# Patient Record
Sex: Female | Born: 1965 | Race: Black or African American | Hispanic: No | Smoking: Current every day smoker
Health system: Southern US, Community
[De-identification: ages and names within clinical notes are randomized; demographics above are authoritative.]

## PROBLEM LIST (undated history)

## (undated) DIAGNOSIS — L84 Corns and callosities: Secondary | ICD-10-CM

## (undated) DIAGNOSIS — F32A Depression, unspecified: Secondary | ICD-10-CM

## (undated) DIAGNOSIS — G9589 Other specified diseases of spinal cord: Secondary | ICD-10-CM

## (undated) DIAGNOSIS — M479 Spondylosis, unspecified: Secondary | ICD-10-CM

## (undated) DIAGNOSIS — I10 Essential (primary) hypertension: Secondary | ICD-10-CM

## (undated) DIAGNOSIS — J449 Chronic obstructive pulmonary disease, unspecified: Secondary | ICD-10-CM

## (undated) DIAGNOSIS — F329 Major depressive disorder, single episode, unspecified: Secondary | ICD-10-CM

## (undated) DIAGNOSIS — F419 Anxiety disorder, unspecified: Secondary | ICD-10-CM

## (undated) HISTORY — DX: Corns and callosities: L84

## (undated) HISTORY — DX: Other specified diseases of spinal cord: G95.89

## (undated) HISTORY — DX: Anxiety disorder, unspecified: F41.9

## (undated) HISTORY — DX: Chronic obstructive pulmonary disease, unspecified: J44.9

## (undated) HISTORY — PX: SPINE SURGERY: SHX786

## (undated) HISTORY — DX: Major depressive disorder, single episode, unspecified: F32.9

## (undated) HISTORY — PX: RIGHT OOPHORECTOMY: SHX2359

## (undated) HISTORY — DX: Depression, unspecified: F32.A

## (undated) HISTORY — DX: Spondylosis, unspecified: M47.9

## (undated) HISTORY — PX: CERVICAL SPINE SURGERY: SHX589

---

## 2005-03-31 ENCOUNTER — Emergency Department: Payer: Self-pay | Admitting: General Practice

## 2005-05-06 ENCOUNTER — Emergency Department: Payer: Self-pay | Admitting: Emergency Medicine

## 2005-10-18 ENCOUNTER — Inpatient Hospital Stay: Payer: Self-pay | Admitting: Obstetrics and Gynecology

## 2006-08-14 ENCOUNTER — Emergency Department: Payer: Self-pay | Admitting: Emergency Medicine

## 2007-02-28 ENCOUNTER — Emergency Department: Payer: Self-pay | Admitting: Unknown Physician Specialty

## 2008-07-21 ENCOUNTER — Emergency Department: Payer: Self-pay | Admitting: Emergency Medicine

## 2008-07-21 ENCOUNTER — Other Ambulatory Visit: Payer: Self-pay

## 2009-11-19 ENCOUNTER — Emergency Department: Payer: Self-pay | Admitting: Emergency Medicine

## 2010-01-16 ENCOUNTER — Emergency Department: Payer: Self-pay | Admitting: Emergency Medicine

## 2010-07-11 ENCOUNTER — Ambulatory Visit: Payer: Self-pay | Admitting: Family Medicine

## 2010-11-13 ENCOUNTER — Emergency Department: Payer: Self-pay | Admitting: Emergency Medicine

## 2011-01-29 ENCOUNTER — Emergency Department: Payer: Self-pay | Admitting: Emergency Medicine

## 2011-11-04 ENCOUNTER — Emergency Department: Payer: Self-pay | Admitting: Internal Medicine

## 2011-11-04 LAB — CBC
HCT: 36.7 % (ref 35.0–47.0)
HGB: 11.9 g/dL — ABNORMAL LOW (ref 12.0–16.0)
MCH: 29.3 pg (ref 26.0–34.0)
MCHC: 32.4 g/dL (ref 32.0–36.0)
Platelet: 266 10*3/uL (ref 150–440)
RDW: 14.6 % — ABNORMAL HIGH (ref 11.5–14.5)
WBC: 10.1 10*3/uL (ref 3.6–11.0)

## 2011-11-04 LAB — URINALYSIS, COMPLETE
Bacteria: NONE SEEN
Bilirubin,UR: NEGATIVE
Glucose,UR: NEGATIVE mg/dL (ref 0–75)
Protein: 30
RBC,UR: 12 /HPF (ref 0–5)
Specific Gravity: 1.028 (ref 1.003–1.030)
Squamous Epithelial: 2
WBC UR: 6 /HPF (ref 0–5)

## 2011-11-04 LAB — COMPREHENSIVE METABOLIC PANEL
Anion Gap: 11 (ref 7–16)
Bilirubin,Total: 0.4 mg/dL (ref 0.2–1.0)
Calcium, Total: 9 mg/dL (ref 8.5–10.1)
Chloride: 107 mmol/L (ref 98–107)
Co2: 22 mmol/L (ref 21–32)
Creatinine: 0.64 mg/dL (ref 0.60–1.30)
EGFR (African American): >60
Osmolality: 277 (ref 275–301)
SGPT (ALT): 14 U/L
Sodium: 140 mmol/L (ref 136–145)
Total Protein: 8.1 g/dL (ref 6.4–8.2)

## 2011-11-04 LAB — LIPASE, BLOOD: Lipase: 67 U/L — ABNORMAL LOW (ref 73–393)

## 2011-11-04 LAB — PREGNANCY, URINE: Pregnancy Test, Urine: NEGATIVE m[IU]/mL

## 2011-11-04 LAB — TROPONIN I: Troponin-I: <0.02 ng/mL

## 2011-11-05 ENCOUNTER — Emergency Department: Payer: Self-pay | Admitting: Emergency Medicine

## 2011-11-05 LAB — COMPREHENSIVE METABOLIC PANEL
Albumin: 4.4 g/dL (ref 3.4–5.0)
Alkaline Phosphatase: 50 U/L (ref 50–136)
Anion Gap: 12 (ref 7–16)
Calcium, Total: 9.4 mg/dL (ref 8.5–10.1)
Co2: 25 mmol/L (ref 21–32)
EGFR (African American): >60
EGFR (Non-African Amer.): >60
Osmolality: 281 (ref 275–301)
Potassium: 3.7 mmol/L (ref 3.5–5.1)
SGOT(AST): 19 U/L (ref 15–37)
SGPT (ALT): 15 U/L
Sodium: 142 mmol/L (ref 136–145)

## 2011-11-05 LAB — URINALYSIS, COMPLETE
Bilirubin,UR: NEGATIVE
Glucose,UR: NEGATIVE mg/dL (ref 0–75)
Nitrite: NEGATIVE
Ph: 6 (ref 4.5–8.0)
Protein: 100
RBC,UR: 38 /HPF (ref 0–5)
WBC UR: 26 /HPF (ref 0–5)

## 2011-11-05 LAB — CBC
HCT: 39.3 % (ref 35.0–47.0)
HGB: 12.7 g/dL (ref 12.0–16.0)
MCH: 29.2 pg (ref 26.0–34.0)
MCHC: 32.2 g/dL (ref 32.0–36.0)
MCV: 91 fL (ref 80–100)
RDW: 14.9 % — ABNORMAL HIGH (ref 11.5–14.5)

## 2012-04-03 ENCOUNTER — Emergency Department: Payer: Self-pay | Admitting: Emergency Medicine

## 2012-04-03 LAB — COMPREHENSIVE METABOLIC PANEL
Albumin: 3.6 g/dL (ref 3.4–5.0)
Alkaline Phosphatase: 76 U/L (ref 50–136)
Anion Gap: 8 (ref 7–16)
BUN: 8 mg/dL (ref 7–18)
Calcium, Total: 8.9 mg/dL (ref 8.5–10.1)
Co2: 26 mmol/L (ref 21–32)
Creatinine: 0.81 mg/dL (ref 0.60–1.30)
Glucose: 110 mg/dL — ABNORMAL HIGH (ref 65–99)
Potassium: 3.9 mmol/L (ref 3.5–5.1)
SGOT(AST): 46 U/L — ABNORMAL HIGH (ref 15–37)
SGPT (ALT): 48 U/L
Sodium: 140 mmol/L (ref 136–145)
Total Protein: 7.5 g/dL (ref 6.4–8.2)

## 2012-04-03 LAB — URINALYSIS, COMPLETE
Bilirubin,UR: NEGATIVE
Glucose,UR: NEGATIVE mg/dL (ref 0–75)
Leukocyte Esterase: NEGATIVE
Ph: 6 (ref 4.5–8.0)
Protein: NEGATIVE
RBC,UR: 4 /HPF (ref 0–5)
Specific Gravity: 1.014 (ref 1.003–1.030)

## 2012-04-03 LAB — CBC
HCT: 36.2 % (ref 35.0–47.0)
HGB: 11.4 g/dL — ABNORMAL LOW (ref 12.0–16.0)
MCH: 27.9 pg (ref 26.0–34.0)
MCHC: 31.6 g/dL — ABNORMAL LOW (ref 32.0–36.0)
MCV: 88 fL (ref 80–100)
Platelet: 246 10*3/uL (ref 150–440)
RBC: 4.1 10*6/uL (ref 3.80–5.20)
RDW: 16.1 % — ABNORMAL HIGH (ref 11.5–14.5)

## 2012-09-04 ENCOUNTER — Ambulatory Visit: Payer: Self-pay | Admitting: Family Medicine

## 2013-02-06 DIAGNOSIS — F4310 Post-traumatic stress disorder, unspecified: Secondary | ICD-10-CM | POA: Insufficient documentation

## 2013-02-06 DIAGNOSIS — F331 Major depressive disorder, recurrent, moderate: Secondary | ICD-10-CM | POA: Insufficient documentation

## 2013-09-06 ENCOUNTER — Emergency Department: Payer: Self-pay | Admitting: Emergency Medicine

## 2013-09-06 LAB — PHOSPHORUS: Phosphorus: 2.3 mg/dL — ABNORMAL LOW (ref 2.5–4.9)

## 2013-09-06 LAB — BASIC METABOLIC PANEL
Anion Gap: 5 — ABNORMAL LOW (ref 7–16)
BUN: 7 mg/dL (ref 7–18)
Calcium, Total: 9 mg/dL (ref 8.5–10.1)
Co2: 26 mmol/L (ref 21–32)
Glucose: 77 mg/dL (ref 65–99)
Osmolality: 276 (ref 275–301)
Sodium: 140 mmol/L (ref 136–145)

## 2013-11-19 ENCOUNTER — Ambulatory Visit: Payer: Self-pay | Admitting: Family Medicine

## 2013-12-07 ENCOUNTER — Ambulatory Visit: Payer: Self-pay | Admitting: Pain Medicine

## 2013-12-15 ENCOUNTER — Ambulatory Visit: Payer: Self-pay | Admitting: Pain Medicine

## 2014-04-16 ENCOUNTER — Other Ambulatory Visit: Payer: Self-pay | Admitting: Pain Medicine

## 2014-04-16 LAB — BASIC METABOLIC PANEL
Anion Gap: 4 — ABNORMAL LOW (ref 7–16)
BUN: 6 mg/dL — ABNORMAL LOW (ref 7–18)
CALCIUM: 9.3 mg/dL (ref 8.5–10.1)
Chloride: 105 mmol/L (ref 98–107)
Co2: 28 mmol/L (ref 21–32)
Creatinine: 0.75 mg/dL (ref 0.60–1.30)
Glucose: 85 mg/dL (ref 65–99)
OSMOLALITY: 271 (ref 275–301)
Potassium: 3.2 mmol/L — ABNORMAL LOW (ref 3.5–5.1)
Sodium: 137 mmol/L (ref 136–145)

## 2014-04-16 LAB — HEPATIC FUNCTION PANEL A (ARMC)
ALBUMIN: 4.1 g/dL (ref 3.4–5.0)
ALK PHOS: 60 U/L
AST: 9 U/L — AB (ref 15–37)
BILIRUBIN TOTAL: 0.6 mg/dL (ref 0.2–1.0)
Bilirubin, Direct: 0.2 mg/dL (ref 0.00–0.20)
SGPT (ALT): 14 U/L (ref 12–78)
Total Protein: 7.9 g/dL (ref 6.4–8.2)

## 2014-04-16 LAB — MAGNESIUM: MAGNESIUM: 1.8 mg/dL

## 2014-04-16 LAB — SEDIMENTATION RATE: Erythrocyte Sed Rate: 20 mm/hr (ref 0–20)

## 2014-06-14 ENCOUNTER — Ambulatory Visit: Payer: Self-pay | Admitting: Pain Medicine

## 2014-06-30 ENCOUNTER — Encounter: Payer: Self-pay | Admitting: Pain Medicine

## 2014-06-30 ENCOUNTER — Ambulatory Visit: Payer: Self-pay | Admitting: Pain Medicine

## 2014-07-12 ENCOUNTER — Other Ambulatory Visit: Payer: Self-pay | Admitting: Pain Medicine

## 2014-07-12 LAB — SEDIMENTATION RATE: Erythrocyte Sed Rate: 17 mm/hr (ref 0–20)

## 2014-07-14 ENCOUNTER — Ambulatory Visit: Payer: Self-pay | Admitting: Pain Medicine

## 2014-07-27 ENCOUNTER — Encounter: Payer: Self-pay | Admitting: Pain Medicine

## 2014-07-28 ENCOUNTER — Ambulatory Visit: Payer: Self-pay | Admitting: Pain Medicine

## 2014-08-25 ENCOUNTER — Ambulatory Visit: Payer: Self-pay | Admitting: Pain Medicine

## 2014-08-26 ENCOUNTER — Encounter: Payer: Self-pay | Admitting: Pain Medicine

## 2015-01-07 ENCOUNTER — Emergency Department: Payer: Self-pay | Admitting: Emergency Medicine

## 2015-07-09 ENCOUNTER — Encounter: Payer: Self-pay | Admitting: Emergency Medicine

## 2015-07-09 ENCOUNTER — Emergency Department
Admission: EM | Admit: 2015-07-09 | Discharge: 2015-07-09 | Disposition: A | Discharge status: Home / Self Care | Payer: Medicare Other | Attending: Emergency Medicine | Admitting: Emergency Medicine

## 2015-07-09 DIAGNOSIS — H00012 Hordeolum externum right lower eyelid: Secondary | ICD-10-CM | POA: Insufficient documentation

## 2015-07-09 DIAGNOSIS — L723 Sebaceous cyst: Secondary | ICD-10-CM | POA: Diagnosis not present

## 2015-07-09 DIAGNOSIS — Z79899 Other long term (current) drug therapy: Secondary | ICD-10-CM | POA: Insufficient documentation

## 2015-07-09 DIAGNOSIS — Z720 Tobacco use: Secondary | ICD-10-CM | POA: Diagnosis not present

## 2015-07-09 DIAGNOSIS — H5711 Ocular pain, right eye: Secondary | ICD-10-CM | POA: Diagnosis present

## 2015-07-09 DIAGNOSIS — H00013 Hordeolum externum right eye, unspecified eyelid: Secondary | ICD-10-CM

## 2015-07-09 MED ORDER — TETRACAINE HCL 0.5 % OP SOLN
2.0000 [drp] | Freq: Once | OPHTHALMIC | Status: DC
  Filled 2015-07-09: qty 2

## 2015-07-09 MED ORDER — FLUORESCEIN SODIUM 1 MG OP STRP
1.0000 | ORAL_STRIP | Freq: Once | OPHTHALMIC | Status: DC
  Filled 2015-07-09: qty 1

## 2015-07-09 MED ORDER — FLUORESCEIN SODIUM 1 MG OP STRP: 1.0000 | ORAL_STRIP | Freq: Once | OPHTHALMIC | Status: DC

## 2015-07-09 MED ORDER — ACETAMINOPHEN-CODEINE #3 300-30 MG PO TABS: 1.0000 | ORAL_TABLET | Freq: Three times a day (TID) | ORAL | Status: DC | PRN

## 2015-07-09 NOTE — ED Notes (Signed)
Patient presents to the ED with intermittent right eye swelling and pain as well as decreased vision in her right eye.  Patient denies traumatic injury to eye.  Denies history of eye problems.  Patient does not wear contacts.

## 2015-07-09 NOTE — Discharge Instructions (Signed)
Blepharitis Blepharitis is a skin problem that makes your eyelids watery, red, puffy (swollen), crusty, scaly, or painful. It may also make your eyes itch. You may lose eyelashes. HOME CARE  Keep your hands clean.  Use a clean towel each time you dry your eyelids. Do not share towels or makeup with anyone.  Carefully wash your eyelids and eyelashes 2 times a day. Use warm water and baby shampoo or just water.  Wash your face and eyebrows at least once a day.  Hold a folded washcloth under warm water. Squeeze the water out. Put the warm washcloth on your eyes 2 times a day for 10 minutes, or as told by your doctor.  Apply medicated cream as told by your doctor.  Avoid rubbing your eyes.  Avoid wearing makeup until you get better.  Follow up with your doctor as told. GET HELP RIGHT AWAY IF:  Your pain, redness, or puffiness gets worse.  Your pain, redness, or puffiness spreads to other parts of your face.  Your vision changes, or you have pain when looking at lights or moving objects.  You have a fever.  You do not get better after 2 to 4 days. MAKE SURE YOU:  Understand these instructions.  Will watch your condition.  Will get help right away if you are not doing well or get worse. Document Released: 07/17/2008 Document Revised: 12/31/2011 Document Reviewed: 11/15/2010 [facility] Patient Information 2015 [place], [place]. This information is not intended to replace advice given to you by your health care provider. Make sure you discuss any questions you have with your health care provider.  Sty A sty (hordeolum) is an infection of a gland in the eyelid located at the base of the eyelash. A sty may develop a white or yellow head of pus. It can be puffy (swollen). Usually, the sty will burst and pus will come out on its own. They do not leave lumps in the eyelid once they drain. A sty is often confused with another form of cyst of the eyelid called a chalazion. Chalazions occur  within the eyelid and not on the edge where the bases of the eyelashes are. They often are red, sore and then form firm lumps in the eyelid. CAUSES   Germs (bacteria).  Lasting (chronic) eyelid inflammation. SYMPTOMS   Tenderness, redness and swelling along the edge of the eyelid at the base of the eyelashes.  Sometimes, there is a white or yellow head of pus. It may or may not drain. DIAGNOSIS  An ophthalmologist will be able to distinguish between a sty and a chalazion and treat the condition appropriately.  TREATMENT   Styes are typically treated with warm packs (compresses) until drainage occurs.  In rare cases, medicines that kill germs (antibiotics) may be prescribed. These antibiotics may be in the form of drops, cream or pills.  If a hard lump has formed, it is generally necessary to do a small incision and remove the hardened contents of the cyst in a minor surgical procedure done in the office.  In suspicious cases, your caregiver may send the contents of the cyst to the lab to be certain that it is not a rare, but dangerous form of cancer of the glands of the eyelid. HOME CARE INSTRUCTIONS   Wash your hands often and dry them with a clean towel. Avoid touching your eyelid. This may spread the infection to other parts of the eye.  Apply heat to your eyelid for 10 to 20 minutes,  several times a day, to ease pain and help to heal it faster.  Do not squeeze the sty. Allow it to drain on its own. Wash your eyelid carefully 3 to 4 times per day to remove any pus. SEEK IMMEDIATE MEDICAL CARE IF:   Your eye becomes painful or puffy (swollen).  Your vision changes.  Your sty does not drain by itself within 3 days.  Your sty comes back within a short period of time, even with treatment.  You have redness (inflammation) around the eye.  You have a fever. Document Released: 07/18/2005 Document Revised: 12/31/2011 Document Reviewed: 01/22/2014 [facility] Patient Information  2015 [place], [place]. This information is not intended to replace advice given to you by your health care provider. Make sure you discuss any questions you have with your health care provider.  Epidermal Cyst An epidermal cyst is sometimes called a sebaceous cyst, epidermal inclusion cyst, or infundibular cyst. These cysts usually contain a substance that looks "pasty" or "cheesy" and may have a bad smell. This substance is a protein called keratin. Epidermal cysts are usually found on the face, neck, or trunk. They may also occur in the vaginal area or other parts of the genitalia of both men and women. Epidermal cysts are usually small, painless, slow-growing bumps or lumps that move freely under the skin. It is important not to try to pop them. This may cause an infection and lead to tenderness and swelling. CAUSES  Epidermal cysts may be caused by a deep penetrating injury to the skin or a plugged hair follicle, often associated with acne. SYMPTOMS  Epidermal cysts can become inflamed and cause:  Redness.  Tenderness.  Increased temperature of the skin over the bumps or lumps.  Grayish-white, bad smelling material that drains from the bump or lump. DIAGNOSIS  Epidermal cysts are easily diagnosed by your caregiver during an exam. Rarely, a tissue sample (biopsy) may be taken to rule out other conditions that may resemble epidermal cysts. TREATMENT   Epidermal cysts often get better and disappear on their own. They are rarely ever cancerous.  If a cyst becomes infected, it may become inflamed and tender. This may require opening and draining the cyst. Treatment with antibiotics may be necessary. When the infection is gone, the cyst may be removed with minor surgery.  Small, inflamed cysts can often be treated with antibiotics or by injecting steroid medicines.  Sometimes, epidermal cysts become large and bothersome. If this happens, surgical removal in your caregiver's office may be  necessary. HOME CARE INSTRUCTIONS  Only take over-the-counter or prescription medicines as directed by your caregiver.  Take your antibiotics as directed. Finish them even if you start to feel better. SEEK MEDICAL CARE IF:   Your cyst becomes tender, red, or swollen.  Your condition is not improving or is getting worse.  You have any other questions or concerns. MAKE SURE YOU:  Understand these instructions.  Will watch your condition.  Will get help right away if you are not doing well or get worse. Document Released: 09/08/2004 Document Revised: 12/31/2011 Document Reviewed: 04/16/2011 [facility] Patient Information 2015 [place], [place]. This information is not intended to replace advice given to you by your health care provider. Make sure you discuss any questions you have with your health care provider.  Apply warm compresses to the right eye as discussed.  Use OTC "No More Tears" Baby Shampoo to cleanse eyelids & lashes.  See the Dermatologists for evaluation of your facial cyst.

## 2015-07-09 NOTE — ED Provider Notes (Signed)
[facility] Emergency Department Provider Note ____________________________________________  Time seen: 63  I have reviewed the triage vital signs and the nursing notes.  HISTORY  Chief Complaint  Eye Pain  HPI Diane Knapp is a 49 y.o. female reports to the ED with intermittent right eye pain and swelling. She also notes some decreased vision to the right eye. She denies any traumatic injury, drainage, matting, crusting to the eye. She also is without any headache, nausea, or vomiting.  History reviewed. No pertinent past medical history.  There are no active problems to display for this patient.   Past Surgical History  Procedure Laterality Date  . Spine surgery    . Right oophorectomy      Current Outpatient Rx  Name  Route  Sig  Dispense  Refill  . pregabalin (LYRICA) 150 MG capsule   Oral   Take 150 mg by mouth 2 (two) times daily.         Marland Kitchen acetaminophen-codeine (TYLENOL #3) 300-30 MG per tablet   Oral   Take 1 tablet by mouth every 8 (eight) hours as needed for moderate pain.   10 tablet   0    Allergies Cymbalta  No family history on file.  Social History Social History  Substance Use Topics  . Smoking status: Current Every Day Smoker -- 1.00 packs/day    Types: Cigarettes  . Smokeless tobacco: None  . Alcohol Use: No     Comment: now and then   Review of Systems  Constitutional: Negative for fever. Eyes: Negative for visual changes. Eyelid swelling as above.  ENT: Negative for sore throat. Cardiovascular: Negative for chest pain. Respiratory: Negative for shortness of breath. Gastrointestinal: Negative for abdominal pain, vomiting and diarrhea. Genitourinary: Negative for dysuria. Musculoskeletal: Negative for back pain. Skin: Negative for rash. Neurological: Negative for headaches, focal weakness or numbness. ____________________________________________  PHYSICAL EXAM:  VITAL SIGNS: ED Triage Vitals  Enc  Vitals Group     BP 07/09/15 1742 109/59 mmHg     Pulse Rate 07/09/15 1742 85     Resp 07/09/15 1742 16     Temp 07/09/15 1742 98.2 F (36.8 C)     Temp Source 07/09/15 1742 Oral     SpO2 07/09/15 1742 98 %     Weight 07/09/15 1742 156 lb (70.761 kg)     Height 07/09/15 1742  (1.702 m)     Head Cir --      Peak Flow --      Pain Score 07/09/15 1743 6     Pain Loc --      Pain Edu? --      Excl. in GC? --    Constitutional: Alert and oriented. Well appearing and in no distress. Eyes: Conjunctivae are normal. PERRL. Normal extraocular movements. Right eye with a single, pustule to the upper lid at the lash margin consistent with sty. Local blepharitis noted.  ENT   Head: Normocephalic and atraumatic. Stable round, smooth, mobile, cystic lesion over the forehead at the nasal bridge likely sebaceous cyst versus lipoma. No local erythema, induration, or drainage.     Nose: No congestion/rhinorrhea.   Mouth/Throat: Mucous membranes are moist.   Neck: Supple. No thyromegaly. Hematological/Lymphatic/Immunological: No cervical lymphadenopathy. Cardiovascular: Normal rate, regular rhythm.  Respiratory: Normal respiratory effort. No wheezes/rales/rhonchi. Gastrointestinal: Soft and nontender. No distention. Musculoskeletal: Nontender with normal range of motion in all extremities.  Neurologic:  Normal gait without ataxia. Normal speech and language. No  gross focal neurologic deficits are appreciated. Skin:  Skin is warm, dry and intact. No rash noted. Psychiatric: Mood and affect are normal. Patient exhibits appropriate insight and judgment. ____________________________________________  INITIAL IMPRESSION / ASSESSMENT AND PLAN / ED COURSE  Reassurance to the patient about sty treatment and management. Also referral to dermatology for further evaluation of facial cyst. Patient is to follow-up with [facility] as  needed. ____________________________________________  FINAL CLINICAL IMPRESSION(S) / ED DIAGNOSES  Final diagnoses:  Sty, external, right  Sebaceous cyst      Lissa Hoard, PA-C 07/09/15 2014  Phineas Semen, MD 07/09/15 2105

## 2015-08-31 ENCOUNTER — Encounter: Payer: Self-pay | Admitting: Pain Medicine

## 2015-08-31 ENCOUNTER — Other Ambulatory Visit: Payer: Self-pay | Admitting: Pain Medicine

## 2015-08-31 ENCOUNTER — Ambulatory Visit: Payer: Medicare Other | Attending: Pain Medicine | Admitting: Pain Medicine

## 2015-08-31 VITALS — BP 132/72 | HR 65 | Temp 98.7°F | Resp 16 | Ht 66.0 in | Wt 154.0 lb

## 2015-08-31 DIAGNOSIS — J449 Chronic obstructive pulmonary disease, unspecified: Secondary | ICD-10-CM | POA: Insufficient documentation

## 2015-08-31 DIAGNOSIS — M791 Myalgia: Secondary | ICD-10-CM

## 2015-08-31 DIAGNOSIS — J439 Emphysema, unspecified: Secondary | ICD-10-CM

## 2015-08-31 DIAGNOSIS — Z91199 Patient's noncompliance with other medical treatment and regimen due to unspecified reason: Secondary | ICD-10-CM

## 2015-08-31 DIAGNOSIS — F329 Major depressive disorder, single episode, unspecified: Secondary | ICD-10-CM | POA: Insufficient documentation

## 2015-08-31 DIAGNOSIS — F1210 Cannabis abuse, uncomplicated: Secondary | ICD-10-CM | POA: Insufficient documentation

## 2015-08-31 DIAGNOSIS — F1290 Cannabis use, unspecified, uncomplicated: Secondary | ICD-10-CM

## 2015-08-31 DIAGNOSIS — M792 Neuralgia and neuritis, unspecified: Secondary | ICD-10-CM

## 2015-08-31 DIAGNOSIS — M542 Cervicalgia: Secondary | ICD-10-CM

## 2015-08-31 DIAGNOSIS — M79603 Pain in arm, unspecified: Secondary | ICD-10-CM

## 2015-08-31 DIAGNOSIS — M545 Low back pain, unspecified: Secondary | ICD-10-CM

## 2015-08-31 DIAGNOSIS — F17210 Nicotine dependence, cigarettes, uncomplicated: Secondary | ICD-10-CM | POA: Diagnosis not present

## 2015-08-31 DIAGNOSIS — Z9114 Patient's other noncompliance with medication regimen: Secondary | ICD-10-CM | POA: Diagnosis not present

## 2015-08-31 DIAGNOSIS — F1190 Opioid use, unspecified, uncomplicated: Secondary | ICD-10-CM

## 2015-08-31 DIAGNOSIS — M79606 Pain in leg, unspecified: Secondary | ICD-10-CM

## 2015-08-31 DIAGNOSIS — G9589 Other specified diseases of spinal cord: Secondary | ICD-10-CM | POA: Diagnosis not present

## 2015-08-31 DIAGNOSIS — F1990 Other psychoactive substance use, unspecified, uncomplicated: Secondary | ICD-10-CM

## 2015-08-31 DIAGNOSIS — M79604 Pain in right leg: Secondary | ICD-10-CM | POA: Diagnosis present

## 2015-08-31 DIAGNOSIS — Z79899 Other long term (current) drug therapy: Secondary | ICD-10-CM | POA: Diagnosis not present

## 2015-08-31 DIAGNOSIS — M47812 Spondylosis without myelopathy or radiculopathy, cervical region: Secondary | ICD-10-CM

## 2015-08-31 DIAGNOSIS — M549 Dorsalgia, unspecified: Secondary | ICD-10-CM | POA: Diagnosis present

## 2015-08-31 DIAGNOSIS — G8929 Other chronic pain: Secondary | ICD-10-CM | POA: Diagnosis not present

## 2015-08-31 DIAGNOSIS — M797 Fibromyalgia: Secondary | ICD-10-CM

## 2015-08-31 DIAGNOSIS — M79601 Pain in right arm: Secondary | ICD-10-CM

## 2015-08-31 DIAGNOSIS — M79605 Pain in left leg: Secondary | ICD-10-CM | POA: Diagnosis present

## 2015-08-31 DIAGNOSIS — F411 Generalized anxiety disorder: Secondary | ICD-10-CM

## 2015-08-31 DIAGNOSIS — M5416 Radiculopathy, lumbar region: Secondary | ICD-10-CM | POA: Insufficient documentation

## 2015-08-31 DIAGNOSIS — Z9119 Patient's noncompliance with other medical treatment and regimen: Secondary | ICD-10-CM

## 2015-08-31 DIAGNOSIS — M539 Dorsopathy, unspecified: Secondary | ICD-10-CM

## 2015-08-31 DIAGNOSIS — M7918 Myalgia, other site: Secondary | ICD-10-CM

## 2015-08-31 DIAGNOSIS — M79602 Pain in left arm: Secondary | ICD-10-CM

## 2015-08-31 DIAGNOSIS — M961 Postlaminectomy syndrome, not elsewhere classified: Secondary | ICD-10-CM

## 2015-08-31 DIAGNOSIS — Z79891 Long term (current) use of opiate analgesic: Secondary | ICD-10-CM

## 2015-08-31 HISTORY — DX: Other psychoactive substance use, unspecified, uncomplicated: F19.90

## 2015-08-31 HISTORY — DX: Cannabis use, unspecified, uncomplicated: F12.90

## 2015-08-31 HISTORY — DX: Other long term (current) drug therapy: Z79.899

## 2015-08-31 HISTORY — DX: Patient's noncompliance with other medical treatment and regimen due to unspecified reason: Z91.199

## 2015-08-31 MED ORDER — PREGABALIN 150 MG PO CAPS: 150.0000 mg | ORAL_CAPSULE | Freq: Three times a day (TID) | ORAL | Status: DC

## 2015-08-31 MED ORDER — OXYCODONE HCL 5 MG PO CAPS: 5.0000 mg | ORAL_CAPSULE | Freq: Four times a day (QID) | ORAL | Status: DC | PRN

## 2015-08-31 NOTE — Progress Notes (Signed)
Safety precautions to be maintained throughout the outpatient stay will include: orient to surroundings, keep bed in low position, maintain call bell within reach at all times, provide assistance with transfer out of bed and ambulation.  

## 2015-08-31 NOTE — Progress Notes (Signed)
Patient's Name: Diane Knapp MRN: [id number] DOB: [birth year] DOS: 08/31/2015  Primary Reason(s) for Visit: Encounter for Medication Management. CC: Back Pain; Leg Pain; and Foot Pain   HPI:   Diane Knapp is a 49 y.o. year old, female patient, who returns today as an established patient. 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 Her primarily concern today is the Back Pain; Leg Pain; and Foot Pain    The patient returns in today for clinic today referring that she needs something stronger for pain than the Lyrica. It turns out that she seems to have developed some tolerance to the Lyrica 150 mg by mouth every 8 hours. Today I took the time to talk to her about "drug holidays" and I have instructed her to go down on the Lyrica from 3 tablets per day to 2 tablets per day for 1 week, followed by 1 tablet per day for 1 week, followed by stopping the medicine for 2 weeks. During this time, the patient will be given oxycodone 5 mg to be taken one tablet every 6 hours when necessary for the pain. Once she  completes the drug holiday, then we will discontinue the opioids and continue on the Lyrica has previously done. Today's Pain Score: 10-Worst pain ever Clear symptom exaggeration. Reported level of pain is incompatible with clinical observations.  Pain Type: Chronic pain Pain Location: Back (low back, legs and feet) Pain Orientation: Lower Pain Descriptors / Indicators: Aching, Shooting, Stabbing (back is achy, legs are shooting stabbing) Pain Frequency: Constant  Date of Last Visit:   06/01/2015 Service Provided on Last Visit:   Medication management.  Pharmacotherapy Review:   Side-effects or Adverse reactions: None reported. Effectiveness: Described as relatively effective, allowing for increase in activities of daily living (ADL). Onset of action: Within expected pharmacological parameters. Duration of action: Within normal limits for medication. Peak effect: Timing and results are as within normal expected parameters. Diane Knapp PMP: Compliant with practice rules and regulations. DST: Compliant with practice rules and regulations. Lab work: No new labs ordered by our practice. Treatment compliance: Compliant. Substance Use Disorder (SUD) Risk Level: Low Planned course of action: Continue therapy as is.  Allergies: Diane Knapp is allergic to cymbalta.  Meds: The patient has a current medication list which includes the following prescription(s): aripiprazole, pregabalin, oxycodone, and oxycodone. Requested Prescriptions   Signed Prescriptions Disp Refills  . oxycodone (OXY-IR) 5 MG capsule 120 capsule 0    Sig: Take 1 capsule (5 mg total) by mouth every 6 (six) hours as needed for pain.  Marland Kitchen oxycodone (OXY-IR) 5 MG capsule 120 capsule 0    Sig: Take 1 capsule (5 mg total) by mouth every 6 (six) hours as needed for pain.  . pregabalin (LYRICA) 150 MG capsule 90 capsule 2  Sig: Take 1 capsule (150 mg total) by mouth every 8 (eight) hours.    ROS: Constitutional: Afebrile, no chills,  well hydrated and well nourished Gastrointestinal: negative Musculoskeletal:negative Neurological: negative Behavioral/Psych: negative  PFSH: Medical:  Diane Knapp  has a past medical history of Myelomalacia (HCC); Spondylosis; Depression; COPD (chronic obstructive pulmonary disease) (HCC); and Anxiety. Family: family history includes Cancer in her mother; Diabetes in her mother; Hypertension in her mother; Stroke in her mother. Surgical:  has past surgical history that includes Spine surgery and Right oophorectomy. Tobacco:  reports that she has been smoking Cigarettes.  She has been smoking about 1.00 pack per day. She does not have any smokeless tobacco history on file. Alcohol:  reports that she does not drink alcohol. Drug:  has no drug history on file.  Physical Exam: Vitals:  Today's Vitals   08/31/15 0935 08/31/15 0937  BP: 132/72   Pulse: 65   Temp: 98.7 F (37.1 C)   Resp: 16   Height: 5\' 6"  (1.676 m)   Weight: 154 lb (69.854 kg)   SpO2: 100%   PainSc: 10-Worst pain ever 10-Worst pain ever  PainLoc: Back   Calculated BMI: Body mass index is 24.87 kg/(m^2). General appearance: alert, cooperative, appears stated age, moderate distress and the tremors associated with her condition. Eyes: conjunctivae/corneas clear. PERRL, EOM's intact. Fundi benign. Lungs: No evidence respiratory distress, no audible rales or ronchi and no use of accessory muscles of respiration Neck: no adenopathy, no carotid bruit, no JVD, supple, symmetrical, trachea midline and thyroid not enlarged, symmetric, no tenderness/mass/nodules Back: symmetric, no curvature. ROM normal. No CVA tenderness. Extremities: Constant tremor in all 4 extremities. No change from her initial evaluation. Pulses: 2+ and symmetric Skin: Skin color, texture, turgor normal. No rashes or lesions Neurologic: Gait: Antalgic    Assessment: Encounter Diagnosis:  Primary Diagnosis: Myelomalacia of cervical cord (HCC)  [G95.89]  Plan: Diane Knapp was seen today for back pain, leg pain and foot pain.  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 -     oxycodone (OXY-IR) 5 MG capsule; Take 1 capsule (5 mg total) by mouth every 6 (six) hours as needed for pain.  Fibromyalgia -     pregabalin (LYRICA) 150 MG capsule; Take 1 capsule (150 mg total) by mouth every 8 (eight) hours.  Encounter for long-term (current) use of medications  Neuropathic pain -     pregabalin (LYRICA) 150 MG capsule; Take 1 capsule (150 mg total) by mouth every 8 (eight) hours.  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 Start the Drug Holiday on the Lyrica as described by Dr. Laban EmperorNaveira.  Medications discontinued today:  Medications Discontinued During This Encounter  Medication Reason  . acetaminophen-codeine (TYLENOL #3) 300-30 MG per tablet Error  . pregabalin (LYRICA) 150 MG capsule Reorder   Medications administered today:  Ms. Kincannon had no medications administered during this visit.  Primary Care Physician: Phineas Real Community Location: [facility]  [facility] Facility Note by: Sydnee Levans. Laban Emperor, M.D, DABA, DABAPM, DABPM, DABIPP, FIPP

## 2015-08-31 NOTE — Patient Instructions (Addendum)
A prescription for OXYCODONE X2 and LYRICA was given to you today. Start the Drug Holiday on the Lyrica as described by Dr. Laban EmperorNaveira.

## 2015-09-08 LAB — TOXASSURE SELECT 13 (MW), URINE: PDF: 0

## 2015-09-12 ENCOUNTER — Ambulatory Visit: Payer: Medicare Other | Attending: Specialist

## 2015-09-30 ENCOUNTER — Other Ambulatory Visit: Payer: Self-pay | Admitting: Pain Medicine

## 2015-10-12 ENCOUNTER — Ambulatory Visit: Payer: Medicare Other | Attending: Pain Medicine | Admitting: Pain Medicine

## 2015-10-12 ENCOUNTER — Other Ambulatory Visit: Payer: Self-pay | Admitting: Pain Medicine

## 2015-10-12 ENCOUNTER — Encounter: Payer: Self-pay | Admitting: Pain Medicine

## 2015-10-12 VITALS — BP 149/80 | HR 65 | Temp 98.5°F | Resp 18 | Ht 61.0 in | Wt 155.6 lb

## 2015-10-12 DIAGNOSIS — M549 Dorsalgia, unspecified: Secondary | ICD-10-CM | POA: Insufficient documentation

## 2015-10-12 DIAGNOSIS — F1990 Other psychoactive substance use, unspecified, uncomplicated: Secondary | ICD-10-CM | POA: Diagnosis not present

## 2015-10-12 DIAGNOSIS — Z9114 Patient's other noncompliance with medication regimen: Secondary | ICD-10-CM | POA: Insufficient documentation

## 2015-10-12 DIAGNOSIS — Z91148 Patient's other noncompliance with medication regimen for other reason: Secondary | ICD-10-CM

## 2015-10-12 DIAGNOSIS — F1190 Opioid use, unspecified, uncomplicated: Secondary | ICD-10-CM | POA: Diagnosis not present

## 2015-10-12 DIAGNOSIS — Z79891 Long term (current) use of opiate analgesic: Secondary | ICD-10-CM

## 2015-10-12 DIAGNOSIS — M797 Fibromyalgia: Secondary | ICD-10-CM | POA: Insufficient documentation

## 2015-10-12 DIAGNOSIS — F17210 Nicotine dependence, cigarettes, uncomplicated: Secondary | ICD-10-CM | POA: Insufficient documentation

## 2015-10-12 DIAGNOSIS — M899 Disorder of bone, unspecified: Secondary | ICD-10-CM

## 2015-10-12 DIAGNOSIS — J449 Chronic obstructive pulmonary disease, unspecified: Secondary | ICD-10-CM | POA: Diagnosis not present

## 2015-10-12 DIAGNOSIS — M5412 Radiculopathy, cervical region: Secondary | ICD-10-CM

## 2015-10-12 DIAGNOSIS — Z9889 Other specified postprocedural states: Secondary | ICD-10-CM | POA: Diagnosis not present

## 2015-10-12 DIAGNOSIS — M79606 Pain in leg, unspecified: Secondary | ICD-10-CM | POA: Insufficient documentation

## 2015-10-12 DIAGNOSIS — F329 Major depressive disorder, single episode, unspecified: Secondary | ICD-10-CM | POA: Diagnosis not present

## 2015-10-12 DIAGNOSIS — M898X9 Other specified disorders of bone, unspecified site: Secondary | ICD-10-CM

## 2015-10-12 DIAGNOSIS — G8929 Other chronic pain: Secondary | ICD-10-CM | POA: Diagnosis not present

## 2015-10-12 HISTORY — DX: Other chronic pain: G89.29

## 2015-10-12 HISTORY — DX: Patient's other noncompliance with medication regimen for other reason: Z91.148

## 2015-10-12 NOTE — Progress Notes (Signed)
Safety precautions to be maintained throughout the outpatient stay will include: orient to surroundings, keep bed in low position, maintain call bell within reach at all times, provide assistance with transfer out of bed and ambulation. Did not bring oxycodone bottles for pill count.  Reminded to bring to all appointments.  States she is out.

## 2015-10-12 NOTE — Patient Instructions (Addendum)
Smoking Cessation, Tips for Success If you are ready to quit smoking, congratulations! You have chosen to help yourself be healthier. Cigarettes bring nicotine, tar, carbon monoxide, and other irritants into your body. Your lungs, heart, and blood vessels will be able to work better without these poisons. There are many different ways to quit smoking. Nicotine gum, nicotine patches, a nicotine inhaler, or nicotine nasal spray can help with physical craving. Hypnosis, support groups, and medicines help break the habit of smoking. WHAT THINGS CAN I DO TO MAKE QUITTING EASIER?  Here are some tips to help you quit for good:  Pick a date when you will quit smoking completely. Tell all of your friends and family about your plan to quit on that date.  Do not try to slowly cut down on the number of cigarettes you are smoking. Pick a quit date and quit smoking completely starting on that day.  Throw away all cigarettes.   Clean and remove all ashtrays from your home, work, and car.  On a card, write down your reasons for quitting. Carry the card with you and read it when you get the urge to smoke.  Cleanse your body of nicotine. Drink enough water and fluids to keep your urine clear or pale yellow. Do this after quitting to flush the nicotine from your body.  Learn to predict your moods. Do not let a bad situation be your excuse to have a cigarette. Some situations in your life might tempt you into wanting a cigarette.  Never have "just one" cigarette. It leads to wanting another and another. Remind yourself of your decision to quit.  Change habits associated with smoking. If you smoked while driving or when feeling stressed, try other activities to replace smoking. Stand up when drinking your coffee. Brush your teeth after eating. Sit in a different chair when you read the paper. Avoid alcohol while trying to quit, and try to drink fewer caffeinated beverages. Alcohol and caffeine may urge you to  smoke.  Avoid foods and drinks that can trigger a desire to smoke, such as sugary or spicy foods and alcohol.  Ask people who smoke not to smoke around you.  Have something planned to do right after eating or having a cup of coffee. For example, plan to take a walk or exercise.  Try a relaxation exercise to calm you down and decrease your stress. Remember, you may be tense and nervous for the first 2 weeks after you quit, but this will pass.  Find new activities to keep your hands busy. Play with a pen, coin, or rubber band. Doodle or draw things on paper.  Brush your teeth right after eating. This will help cut down on the craving for the taste of tobacco after meals. You can also try mouthwash.   Use oral substitutes in place of cigarettes. Try using lemon drops, carrots, cinnamon sticks, or chewing gum. Keep them handy so they are available when you have the urge to smoke.  When you have the urge to smoke, try deep breathing.  Designate your home as a nonsmoking area.  If you are a heavy smoker, ask your health care provider about a prescription for nicotine chewing gum. It can ease your withdrawal from nicotine.  Reward yourself. Set aside the cigarette money you save and buy yourself something nice.  Look for support from others. Join a support group or smoking cessation program. Ask someone at home or at work to help you with your plan   to quit smoking.  Always ask yourself, "Do I need this cigarette or is this just a reflex?" Tell yourself, "Today, I choose not to smoke," or "I do not want to smoke." You are reminding yourself of your decision to quit.  Do not replace cigarette smoking with electronic cigarettes (commonly called e-cigarettes). The safety of e-cigarettes is unknown, and some may contain harmful chemicals.  If you relapse, do not give up! Plan ahead and think about what you will do the next time you get the urge to smoke. HOW WILL I FEEL WHEN I QUIT SMOKING? You  may have symptoms of withdrawal because your body is used to nicotine (the addictive substance in cigarettes). You may crave cigarettes, be irritable, feel very hungry, cough often, get headaches, or have difficulty concentrating. The withdrawal symptoms are only temporary. They are strongest when you first quit but will go away within 10-14 days. When withdrawal symptoms occur, stay in control. Think about your reasons for quitting. Remind yourself that these are signs that your body is healing and getting used to being without cigarettes. Remember that withdrawal symptoms are easier to treat than the major diseases that smoking can cause.  Even after the withdrawal is over, expect periodic urges to smoke. However, these cravings are generally short lived and will go away whether you smoke or not. Do not smoke! WHAT RESOURCES ARE AVAILABLE TO HELP ME QUIT SMOKING? Your health care provider can direct you to community resources or hospitals for support, which may include:  Group support.  Education.  Hypnosis.  Therapy.   This information is not intended to replace advice given to you by your health care provider. Make sure you discuss any questions you have with your health care provider.   Document Released: 07/06/2004 Document Revised: 10/29/2014 Document Reviewed: 03/26/2013 Elsevier Interactive Patient Education 2016 [employer] Instructed to get labwork drawn at [facility]

## 2015-10-12 NOTE — Progress Notes (Signed)
Patient's Name: Diane Knapp MRN: [id number] DOB: [birth year] DOS: 10/12/2015  Primary Reason(s) for Visit: Encounter for Medication Management CC: Back Pain and Leg Pain   HPI:    Diane Knapp is a 49 y.o. year old, female patient, who returns today as an established patient. 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 Her primarily concern today is the Back Pain and Leg Pain     The patient comes to clinic today for follow-up evaluation on her recent change in therapy. Unfortunately, she appears to have gone back to bad habits. She has taken all of her medication and did not bring her prescription or her bottle to count the pills. Today I had a very long conversation with the patient with regards to this overuse of medication and the fact that this type of noncompliance will be managed by discontinuing the medication. She understood and accepted. Today was the final warning on  this.  Today's Pain Score: 8 , clinically she looks like a 3/10. Clear symptom exaggeration. Reported level of pain is incompatible with clinical observations. Pain Type: Chronic pain Pain Location: Back (right leg) Pain Orientation: Lower Pain Descriptors / Indicators: Aching, Shooting, Stabbing Pain Frequency: Constant  Date of Last Visit: 08/31/15 Service Provided on Last Visit: Med Refill  Pharmacotherapy Review:   Side-effects or Adverse reactions: None reported Effectiveness: Described as relatively effective, allowing for increase in activities of daily living (ADL) Onset of action: Within expected pharmacological parameters Duration of action: Within normal limits for medication Peak effect: Timing and results are as within normal expected parameters [place] PMP: Compliant with practice rules and regulations UDS Results:   her 08/31/2015 UDS had unreported alprazolam. No opioids. UDS Interpretation: Abnormal results discussed with patient Medication Assessment Form: Reviewed. Patient indicates being compliant with therapy Treatment compliance: Non-compliant. Steps taken to remind the patient of the seriousness of adequate therapy compliance Substance Use Disorder (SUD) Risk Level: High Pharmacologic Plan: Continue therapy as is. No early refills.  Lab Work: Inflammation Markers Lab Results  Component Value Date   ESRSEDRATE 17 07/12/2014    Renal Function Lab Results  Component Value Date   BUN 6* 04/16/2014   CREATININE 0.75 04/16/2014   GFRAA >60 04/16/2014   GFRNONAA >60 04/16/2014    Hepatic Function Lab Results  Component Value Date   AST 9* 04/16/2014   ALT 14 04/16/2014   ALBUMIN 4.1 04/16/2014    Electrolytes Lab Results  Component Value Date   NA 137 04/16/2014   K 3.2* 04/16/2014   CL 105 04/16/2014   CALCIUM 9.3 04/16/2014    Illicit Drugs No results found for: THCU, COCAINSCRNUR, PCPSCRNUR, MDMA, AMPHETMU, METHADONE, ETOH   Allergies:   Ms.  Knapp is allergic to cymbalta.  Meds:  The patient has a current medication list which includes the following prescription(s): aripiprazole, oxycodone, oxycodone, and pregabalin. Requested Prescriptions    No prescriptions requested or ordered in this encounter    ROS:  Constitutional: Afebrile, no chills, well hydrated and well nourished Gastrointestinal: negative Musculoskeletal:negative Neurological: negative Behavioral/Psych: negative  PFSH:  Medical:  Diane Knapp  has a past medical history of Myelomalacia (HCC); Spondylosis; Depression; COPD (chronic obstructive pulmonary disease) (HCC); and Anxiety. Family: family history includes Cancer in her mother; Diabetes in her mother; Hypertension in her mother; Stroke in her mother. Surgical:  has past surgical history that includes Spine surgery and Right oophorectomy. Tobacco:  reports that she has been smoking Cigarettes.  She has been smoking about 1.00 pack per day. She does not have any smokeless tobacco history on file. Alcohol:  reports that she does not drink alcohol. Drug:  reports that she does not use illicit drugs.  Physical Exam:  Vitals:  Today's Vitals   10/12/15 1332 10/12/15 1336  BP:  149/80  Pulse: 65   Temp: 98.5 F (36.9 C)   Resp: 18   Height:  (1.549 m)   Weight: 155 lb 9.6 oz (70.58 kg)   SpO2: 94%   PainSc: 8  8   PainLoc: Back   Calculated BMI: Body mass index is 29.42 kg/(m^2). General appearance: alert, appears stated age and mild distress Eyes: PERLA Respiratory: No evidence respiratory distress, no audible rales or ronchi and no use of accessory muscles of respiration Neck: no adenopathy, no carotid bruit, no JVD, supple, symmetrical, trachea midline and thyroid not enlarged, symmetric, no tenderness/mass/nodules   Assessment:  Encounter Diagnosis:  Primary Diagnosis: Chronic pain [G89.29]  Plan:  Interventional Therapies: None at this time.  Diane Knapp was seen today for back  pain and leg pain.  Diagnoses and all orders for this visit:  Chronic pain -     COMPLETE METABOLIC PANEL WITH GFR -     C-reactive protein -     Magnesium -     Sedimentation rate -     Vitamin B12  Substance use disorder Risk: HIGH  Opiate use  Long term current use of opiate analgesic -     Drugs of abuse screen w/o alc, rtn urine-sln  Bone pain -     Vitamin D pnl(25-hydrxy+1,25-dihy)-bld  Chronic cervical radicular pain (Right)  Noncompliance with medication treatment due to overuse of medication     Patient Instructions  Smoking Cessation, Tips for Success If you are ready to quit smoking, congratulations! You have chosen to help yourself be healthier. Cigarettes bring nicotine, tar, carbon monoxide, and other irritants into your body. Your lungs, heart, and blood vessels will be able to work better without these poisons. There are many different ways to quit smoking. Nicotine gum, nicotine patches, a nicotine inhaler, or nicotine nasal spray can help with physical craving. Hypnosis, support groups, and medicines help break the habit of smoking. WHAT THINGS CAN I DO TO MAKE QUITTING EASIER?  Here are some tips to help you quit for good:  Pick a date when you will quit smoking completely. Tell all of your friends and family about your plan to quit on that date.  Do not try to slowly cut down on the number of cigarettes you are smoking. Pick a quit date and quit smoking completely starting on that day.  Throw away all cigarettes.   Clean and remove all ashtrays from  your home, work, and car.  On a card, write down your reasons for quitting. Carry the card with you and read it when you get the urge to smoke.  Cleanse your body of nicotine. Drink enough water and fluids to keep your urine clear or pale yellow. Do this after quitting to flush the nicotine from your body.  Learn to predict your moods. Do not let a bad situation be your excuse to have a cigarette. Some  situations in your life might tempt you into wanting a cigarette.  Never have "just one" cigarette. It leads to wanting another and another. Remind yourself of your decision to quit.  Change habits associated with smoking. If you smoked while driving or when feeling stressed, try other activities to replace smoking. Stand up when drinking your coffee. Brush your teeth after eating. Sit in a different chair when you read the paper. Avoid alcohol while trying to quit, and try to drink fewer caffeinated beverages. Alcohol and caffeine may urge you to smoke.  Avoid foods and drinks that can trigger a desire to smoke, such as sugary or spicy foods and alcohol.  Ask people who smoke not to smoke around you.  Have something planned to do right after eating or having a cup of coffee. For example, plan to take a walk or exercise.  Try a relaxation exercise to calm you down and decrease your stress. Remember, you may be tense and nervous for the first 2 weeks after you quit, but this will pass.  Find new activities to keep your hands busy. Play with a pen, coin, or rubber band. Doodle or draw things on paper.  Brush your teeth right after eating. This will help cut down on the craving for the taste of tobacco after meals. You can also try mouthwash.   Use oral substitutes in place of cigarettes. Try using lemon drops, carrots, cinnamon sticks, or chewing gum. Keep them handy so they are available when you have the urge to smoke.  When you have the urge to smoke, try deep breathing.  Designate your home as a nonsmoking area.  If you are a heavy smoker, ask your health care provider about a prescription for nicotine chewing gum. It can ease your withdrawal from nicotine.  Reward yourself. Set aside the cigarette money you save and buy yourself something nice.  Look for support from others. Join a support group or smoking cessation program. Ask someone at home or at work to help you with your plan to  quit smoking.  Always ask yourself, "Do I need this cigarette or is this just a reflex?" Tell yourself, "Today, I choose not to smoke," or "I do not want to smoke." You are reminding yourself of your decision to quit.  Do not replace cigarette smoking with electronic cigarettes (commonly called e-cigarettes). The safety of e-cigarettes is unknown, and some may contain harmful chemicals.  If you relapse, do not give up! Plan ahead and think about what you will do the next time you get the urge to smoke. HOW WILL I FEEL WHEN I QUIT SMOKING? You may have symptoms of withdrawal because your body is used to nicotine (the addictive substance in cigarettes). You may crave cigarettes, be irritable, feel very hungry, cough often, get headaches, or have difficulty concentrating. The withdrawal symptoms are only temporary. They are strongest when you first quit but will go away within 10-14 days. When withdrawal symptoms occur, stay in control. Think about your reasons for quitting.  Remind yourself that these are signs that your body is healing and getting used to being without cigarettes. Remember that withdrawal symptoms are easier to treat than the major diseases that smoking can cause.  Even after the withdrawal is over, expect periodic urges to smoke. However, these cravings are generally short lived and will go away whether you smoke or not. Do not smoke! WHAT RESOURCES ARE AVAILABLE TO HELP ME QUIT SMOKING? Your health care provider can direct you to community resources or hospitals for support, which may include:  Group support.  Education.  Hypnosis.  Therapy.   This information is not intended to replace advice given to you by your health care provider. Make sure you discuss any questions you have with your health care provider.   Document Released: 07/06/2004 Document Revised: 10/29/2014 Document Reviewed: 03/26/2013 Elsevier Interactive Patient Education 2016 [employer]. Instructed to  get labwork drawn at [facility]  Medications discontinued today:  There are no discontinued medications. Medications administered today:  Ms. Montesano does not currently have medications on file.  Primary Care Physician: Phineas [facility] Location: [facility] Note by: Sydnee Levans. Laban Emperor, M.D, DABA, DABAPM, DABPM, DABIPP, FIPP

## 2015-10-13 ENCOUNTER — Other Ambulatory Visit
Admission: RE | Admit: 2015-10-13 | Discharge: 2015-10-13 | Disposition: A | Discharge status: Home / Self Care | Payer: Medicare Other | Source: Ambulatory Visit | Attending: Pain Medicine | Admitting: Pain Medicine

## 2015-10-13 DIAGNOSIS — Z029 Encounter for administrative examinations, unspecified: Secondary | ICD-10-CM | POA: Insufficient documentation

## 2015-10-13 LAB — SEDIMENTATION RATE: Sed Rate: 23 mm/hr — ABNORMAL HIGH (ref 0–20)

## 2015-10-13 LAB — COMPREHENSIVE METABOLIC PANEL
ALBUMIN: 4.3 g/dL (ref 3.5–5.0)
ALT: 13 U/L — ABNORMAL LOW (ref 14–54)
ANION GAP: 7 (ref 5–15)
AST: 17 U/L (ref 15–41)
Alkaline Phosphatase: 56 U/L (ref 38–126)
BUN: 9 mg/dL (ref 6–20)
CHLORIDE: 102 mmol/L (ref 101–111)
CO2: 25 mmol/L (ref 22–32)
Calcium: 9.1 mg/dL (ref 8.9–10.3)
Creatinine, Ser: 0.75 mg/dL (ref 0.44–1.00)
GFR calc Af Amer: >60 mL/min (ref >60)
Glucose, Bld: 104 mg/dL — ABNORMAL HIGH (ref 65–99)
POTASSIUM: 3.1 mmol/L — AB (ref 3.5–5.1)
Sodium: 134 mmol/L — ABNORMAL LOW (ref 135–145)
TOTAL PROTEIN: 7.6 g/dL (ref 6.5–8.1)
Total Bilirubin: 0.5 mg/dL (ref 0.3–1.2)

## 2015-10-13 LAB — MAGNESIUM: MAGNESIUM: 1.8 mg/dL (ref 1.7–2.4)

## 2015-10-13 LAB — VITAMIN B12: VITAMIN B 12: 235 pg/mL (ref 180–914)

## 2015-10-13 NOTE — Progress Notes (Signed)
Quick Note:   A normal sedimentation rate should be below 30 mm/hr. The sed rate is an acute phase reactant that indirectly measures the degree of inflammation present in the body. It can be acute, developing rapidly after trauma, injury or infection, for example, or can occur over an extended time (chronic) with conditions such as autoimmune diseases or cancer. The ESR is not diagnostic; it is a non-specific, screening test that may be elevated in a number of these different conditions. It provides general information about the presence or absence of an inflammatory condition.  ______ 

## 2015-10-14 LAB — C-REACTIVE PROTEIN: CRP: <0.5 mg/dL (ref <1.0)

## 2015-10-18 LAB — VITAMIN D PNL(25-HYDRXY+1,25-DIHY)-BLD
VIT D 1 25 DIHYDROXY: 54.9 pg/mL (ref 19.9–79.3)
VIT D 25 HYDROXY: 20.3 ng/mL — AB (ref 30.0–100.0)

## 2015-10-20 ENCOUNTER — Other Ambulatory Visit: Payer: Self-pay | Admitting: Pain Medicine

## 2015-10-20 ENCOUNTER — Encounter: Payer: Self-pay | Admitting: Pain Medicine

## 2015-10-20 DIAGNOSIS — E559 Vitamin D deficiency, unspecified: Secondary | ICD-10-CM

## 2015-10-20 LAB — TOXASSURE SELECT 13 (MW), URINE: PDF: 0

## 2015-10-20 MED ORDER — VITAMIN D (ERGOCALCIFEROL) 1.25 MG (50000 UNIT) PO CAPS: 50000.0000 [IU] | ORAL_CAPSULE | ORAL | Status: AC

## 2015-10-20 MED ORDER — VITAMIN D3 50 MCG (2000 UT) PO CAPS: 2000.0000 [IU] | ORAL_CAPSULE | Freq: Every day | ORAL | Status: AC

## 2015-10-20 NOTE — Progress Notes (Signed)

## 2015-10-27 ENCOUNTER — Ambulatory Visit: Payer: Medicare Other | Attending: Pain Medicine | Admitting: Pain Medicine

## 2015-10-27 ENCOUNTER — Encounter: Payer: Self-pay | Admitting: Pain Medicine

## 2015-10-27 VITALS — BP 139/78 | HR 76 | Temp 98.3°F | Resp 18 | Ht 65.0 in | Wt 155.0 lb

## 2015-10-27 DIAGNOSIS — M79606 Pain in leg, unspecified: Secondary | ICD-10-CM | POA: Insufficient documentation

## 2015-10-27 DIAGNOSIS — F17210 Nicotine dependence, cigarettes, uncomplicated: Secondary | ICD-10-CM | POA: Insufficient documentation

## 2015-10-27 DIAGNOSIS — J449 Chronic obstructive pulmonary disease, unspecified: Secondary | ICD-10-CM | POA: Diagnosis not present

## 2015-10-27 DIAGNOSIS — G8929 Other chronic pain: Secondary | ICD-10-CM | POA: Insufficient documentation

## 2015-10-27 DIAGNOSIS — R700 Elevated erythrocyte sedimentation rate: Secondary | ICD-10-CM

## 2015-10-27 DIAGNOSIS — G9589 Other specified diseases of spinal cord: Secondary | ICD-10-CM

## 2015-10-27 DIAGNOSIS — M47812 Spondylosis without myelopathy or radiculopathy, cervical region: Secondary | ICD-10-CM | POA: Insufficient documentation

## 2015-10-27 DIAGNOSIS — Z79891 Long term (current) use of opiate analgesic: Secondary | ICD-10-CM | POA: Diagnosis not present

## 2015-10-27 DIAGNOSIS — M549 Dorsalgia, unspecified: Secondary | ICD-10-CM | POA: Insufficient documentation

## 2015-10-27 DIAGNOSIS — M961 Postlaminectomy syndrome, not elsewhere classified: Secondary | ICD-10-CM | POA: Diagnosis not present

## 2015-10-27 DIAGNOSIS — F419 Anxiety disorder, unspecified: Secondary | ICD-10-CM | POA: Diagnosis not present

## 2015-10-27 DIAGNOSIS — F329 Major depressive disorder, single episode, unspecified: Secondary | ICD-10-CM | POA: Diagnosis not present

## 2015-10-27 DIAGNOSIS — E559 Vitamin D deficiency, unspecified: Secondary | ICD-10-CM | POA: Insufficient documentation

## 2015-10-27 DIAGNOSIS — Z9114 Patient's other noncompliance with medication regimen: Secondary | ICD-10-CM | POA: Insufficient documentation

## 2015-10-27 DIAGNOSIS — M797 Fibromyalgia: Secondary | ICD-10-CM | POA: Diagnosis not present

## 2015-10-27 DIAGNOSIS — F1190 Opioid use, unspecified, uncomplicated: Secondary | ICD-10-CM

## 2015-10-27 HISTORY — DX: Hypomagnesemia: E83.42

## 2015-10-27 MED ORDER — OXYCODONE HCL 5 MG PO CAPS: 5.0000 mg | ORAL_CAPSULE | Freq: Four times a day (QID) | ORAL | Status: DC | PRN

## 2015-10-27 MED ORDER — MAGNESIUM OXIDE -MG SUPPLEMENT 500 MG PO CAPS: 1.0000 | ORAL_CAPSULE | Freq: Every day | ORAL | Status: DC

## 2015-10-27 MED ORDER — METHYLPREDNISOLONE 4 MG PO TBPK: ORAL_TABLET | ORAL | Status: DC

## 2015-10-27 NOTE — Patient Instructions (Signed)
Smoking Cessation, Tips for Success If you are ready to quit smoking, congratulations! You have chosen to help yourself be healthier. Cigarettes bring nicotine, tar, carbon monoxide, and other irritants into your body. Your lungs, heart, and blood vessels will be able to work better without these poisons. There are many different ways to quit smoking. Nicotine gum, nicotine patches, a nicotine inhaler, or nicotine nasal spray can help with physical craving. Hypnosis, support groups, and medicines help break the habit of smoking. WHAT THINGS CAN I DO TO MAKE QUITTING EASIER?  Here are some tips to help you quit for good:  Pick a date when you will quit smoking completely. Tell all of your friends and family about your plan to quit on that date.  Do not try to slowly cut down on the number of cigarettes you are smoking. Pick a quit date and quit smoking completely starting on that day.  Throw away all cigarettes.   Clean and remove all ashtrays from your home, work, and car.  On a card, write down your reasons for quitting. Carry the card with you and read it when you get the urge to smoke.  Cleanse your body of nicotine. Drink enough water and fluids to keep your urine clear or pale yellow. Do this after quitting to flush the nicotine from your body.  Learn to predict your moods. Do not let a bad situation be your excuse to have a cigarette. Some situations in your life might tempt you into wanting a cigarette.  Never have "just one" cigarette. It leads to wanting another and another. Remind yourself of your decision to quit.  Change habits associated with smoking. If you smoked while driving or when feeling stressed, try other activities to replace smoking. Stand up when drinking your coffee. Brush your teeth after eating. Sit in a different chair when you read the paper. Avoid alcohol while trying to quit, and try to drink fewer caffeinated beverages. Alcohol and caffeine may urge you to  smoke.  Avoid foods and drinks that can trigger a desire to smoke, such as sugary or spicy foods and alcohol.  Ask people who smoke not to smoke around you.  Have something planned to do right after eating or having a cup of coffee. For example, plan to take a walk or exercise.  Try a relaxation exercise to calm you down and decrease your stress. Remember, you may be tense and nervous for the first 2 weeks after you quit, but this will pass.  Find new activities to keep your hands busy. Play with a pen, coin, or rubber band. Doodle or draw things on paper.  Brush your teeth right after eating. This will help cut down on the craving for the taste of tobacco after meals. You can also try mouthwash.   Use oral substitutes in place of cigarettes. Try using lemon drops, carrots, cinnamon sticks, or chewing gum. Keep them handy so they are available when you have the urge to smoke.  When you have the urge to smoke, try deep breathing.  Designate your home as a nonsmoking area.  If you are a heavy smoker, ask your health care provider about a prescription for nicotine chewing gum. It can ease your withdrawal from nicotine.  Reward yourself. Set aside the cigarette money you save and buy yourself something nice.  Look for support from others. Join a support group or smoking cessation program. Ask someone at home or at work to help you with your plan   to quit smoking.  Always ask yourself, "Do I need this cigarette or is this just a reflex?" Tell yourself, "Today, I choose not to smoke," or "I do not want to smoke." You are reminding yourself of your decision to quit.  Do not replace cigarette smoking with electronic cigarettes (commonly called e-cigarettes). The safety of e-cigarettes is unknown, and some may contain harmful chemicals.  If you relapse, do not give up! Plan ahead and think about what you will do the next time you get the urge to smoke. HOW WILL I FEEL WHEN I QUIT SMOKING? You  may have symptoms of withdrawal because your body is used to nicotine (the addictive substance in cigarettes). You may crave cigarettes, be irritable, feel very hungry, cough often, get headaches, or have difficulty concentrating. The withdrawal symptoms are only temporary. They are strongest when you first quit but will go away within 10-14 days. When withdrawal symptoms occur, stay in control. Think about your reasons for quitting. Remind yourself that these are signs that your body is healing and getting used to being without cigarettes. Remember that withdrawal symptoms are easier to treat than the major diseases that smoking can cause.  Even after the withdrawal is over, expect periodic urges to smoke. However, these cravings are generally short lived and will go away whether you smoke or not. Do not smoke! WHAT RESOURCES ARE AVAILABLE TO HELP ME QUIT SMOKING? Your health care provider can direct you to community resources or hospitals for support, which may include:  Group support.  Education.  Hypnosis.  Therapy.   This information is not intended to replace advice given to you by your health care provider. Make sure you discuss any questions you have with your health care provider.   Document Released: 07/06/2004 Document Revised: 10/29/2014 Document Reviewed: 03/26/2013 Elsevier Interactive Patient Education 2016 [employer] Inc[employer]  

## 2015-10-27 NOTE — Progress Notes (Signed)
Safety precautions to be maintained throughout the outpatient stay will include: orient to surroundings, keep bed in low position, maintain call bell within reach at all times, provide assistance with transfer out of bed and ambulation.  

## 2015-10-31 NOTE — Progress Notes (Signed)
Patient's Name: Diane Knapp MRN: [id number] DOB: 03/01/1966 DOS: 10/27/2015  Primary Reason(s) for Visit: Encounter for Medication Management CC: Leg Pain; Foot Pain; and Back Pain   HPI:    [name] is a 50 y.o. year old, female patient, who returns today as an established patient. 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); Chronic upper extremity pain (Bilateral) (R>L); Chronic pain of lower extremity (Bilateral) (L>R); Diffuse myofascial pain syndrome; Substance use disorder Risk: HIGH; Marijuana use; Illicit drug use; Noncompliance; Major depressive disorder (HCC); Emphysema of lung (HCC); Chronic obstructive pulmonary disease (COPD) (HCC); Generalized anxiety disorder; Failed back surgical syndrome; Chronic low back pain; Chronic lumbar radicular pain (L>R); Bone pain; Chronic cervical radicular pain (Right); Noncompliance with medication treatment due to overuse of medication; Vitamin D deficiency; Hypomagnesemia; and Elevated sedimentation rate on her problem list.. Her primarily concern today is the Leg Pain; Foot Pain; and Back Pain     The patient returns to the clinic today for pharmacological management of her chronic pain.  Today's Pain Score: 8 , clinically she looks like a 1-2/10. Reported level of pain is incompatible with clinical obrservations. This may be secondary to a possible lack of understanding on how the pain scale works. Pain Type: Chronic pain Pain Location: Leg (feet and  back ) Pain Orientation:  (bilateral legs and feet, low  back) Pain Descriptors / Indicators: Aching, Shooting, Stabbing Pain Frequency: Constant       Pharmacotherapy Review:   Side-effects or Adverse reactions: None reported Effectiveness: Described as relatively effective, allowing for increase in activities of daily living (ADL) Onset of action: Within expected pharmacological parameters Duration of action: Within normal limits for medication Peak effect: Timing and results are as within normal expected parameters [place] PMP: Compliant with practice rules and regulations UDS Results: UDS results done on 10/12/2015 demonstrated the patient to have her oxycodone on the system, but turns suddenly, she had no metabolites. This could suggest that the patient took a pill just before coming into the clinic. However the levels were considerably high in terms of the oxycodone itself. This opens the possibility that she simply eliminated metabolites a lot quicker. UDS Interpretation: Patient appears to be compliant with practice rules and regulations Medication Assessment Form: Reviewed. Patient indicates being compliant with therapy Treatment compliance: Compliant Substance Use Disorder (SUD) Risk Level: High. The patient remains high due to the fact that on her last visit it was uncovered that she had taken more medication than prescribed. At the time the patient was given a final warning on this. Pharmacologic Plan: Continue therapy as is  Lab Work: Illicit Drugs No results found for: THCU, COCAINSCRNUR, PCPSCRNUR, MDMA, AMPHETMU, METHADONE, ETOH  Inflammation Markers Lab Results  Component Value Date   ESRSEDRATE 23* 10/13/2015   CRP <0.5 10/13/2015    Renal Function Lab Results  Component Value Date   BUN 9 10/13/2015   CREATININE 0.75 10/13/2015   GFRAA >60 10/13/2015   GFRNONAA >60 10/13/2015    Hepatic Function Lab Results  Component Value Date   AST 17 10/13/2015   ALT 13* 10/13/2015   ALBUMIN 4.3 10/13/2015     Electrolytes Lab  Results  Component Value Date   NA 134* 10/13/2015   K 3.1* 10/13/2015   CL 102 10/13/2015   CALCIUM 9.1 10/13/2015   MG 1.8 10/13/2015    Allergies:  Ms. Diane Knapp is allergic to cymbalta.  Meds:  The patient has a current medication list which includes the following prescription(s): aripiprazole, vitamin d3, pregabalin, vitamin d (ergocalciferol), magnesium oxide, methylprednisolone, and oxycodone. Requested Prescriptions   Signed Prescriptions Disp Refills  . Magnesium Oxide 500 MG CAPS 100 capsule PRN    Sig: Take 1 capsule (500 mg total) by mouth daily.  . methylPREDNISolone (MEDROL) 4 MG TBPK tablet 20 tablet 0    Sig: Take as instructed in the package.  Marland Kitchen. oxycodone (OXY-IR) 5 MG capsule 120 capsule 0    Sig: Take 1 capsule (5 mg total) by mouth every 6 (six) hours as needed for pain.    ROS:  Constitutional: Afebrile, no chills, well hydrated and well nourished Gastrointestinal: negative Musculoskeletal:negative Neurological: negative Behavioral/Psych: negative  PFSH:  Medical:  Ms. Diane Knapp  has a past medical history of Myelomalacia (HCC); Spondylosis; Depression; COPD (chronic obstructive pulmonary disease) (HCC); and Anxiety. Family: family history includes Cancer in her mother; Diabetes in her mother; Hypertension in her mother; Stroke in her mother. Surgical:  has past surgical history that includes Spine surgery and Right oophorectomy. Tobacco:  reports that she has been smoking Cigarettes.  She has been smoking about 1.00 pack per day. She does not have any smokeless tobacco history on file. Alcohol:  reports that she does not drink alcohol. Drug:  reports that she does not use illicit drugs.  Physical Exam:  Vitals:  Today's Vitals   10/27/15 1307 10/27/15 1310  BP:  139/78  Pulse:  76  Temp: 98.3 F (36.8 C)   Resp: 18   Height: 5\' 5"  (1.651 m)   Weight: 155 lb (70.308 kg)   SpO2:  100%  PainSc: 8  8   PainLoc: Leg    Calculated BMI: Body mass index is 25.79 kg/(m^2). General appearance: alert, appears stated age and no distress Eyes: PERLA Respiratory: No evidence respiratory distress, no audible rales or ronchi and no use of accessory muscles of respiration Neck: no adenopathy, no carotid bruit, no JVD, supple, symmetrical, trachea midline and thyroid not enlarged, symmetric, no tenderness/mass/nodules  Assessment:  Encounter Diagnosis:  Primary Diagnosis: Chronic pain [G89.29]  Plan:   Interventional Therapies: None at this point.   [name] was seen today for leg pain, foot pain and back pain.  Diagnoses and all orders for this visit:  Chronic pain -     oxycodone (OXY-IR) 5 MG capsule; Take 1 capsule (5 mg total) by mouth every 6 (six) hours as needed for pain.  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  Elevated sedimentation rate -     methylPREDNISolone (MEDROL) 4 MG TBPK tablet; Take as instructed in the package.     Patient Instructions  Smoking Cessation, Tips for Success If you are ready to quit smoking, congratulations! You have chosen to help yourself be healthier. Cigarettes bring nicotine, tar, carbon monoxide, and other irritants into your body. Your lungs, heart, and blood vessels will be able to work better without these poisons. There are  many different ways to quit smoking. Nicotine gum, nicotine patches, a nicotine inhaler, or nicotine nasal spray can help with physical craving. Hypnosis, support groups, and medicines help break the habit of smoking. WHAT THINGS CAN I DO TO MAKE QUITTING EASIER?  Here are some tips to help you quit for good:  Pick a date when you will quit smoking completely. Tell all of your friends and family  about your plan to quit on that date.  Do not try to slowly cut down on the number of cigarettes you are smoking. Pick a quit date and quit smoking completely starting on that day.  Throw away all cigarettes.   Clean and remove all ashtrays from your home, work, and car.  On a card, write down your reasons for quitting. Carry the card with you and read it when you get the urge to smoke.  Cleanse your body of nicotine. Drink enough water and fluids to keep your urine clear or pale yellow. Do this after quitting to flush the nicotine from your body.  Learn to predict your moods. Do not let a bad situation be your excuse to have a cigarette. Some situations in your life might tempt you into wanting a cigarette.  Never have "just one" cigarette. It leads to wanting another and another. Remind yourself of your decision to quit.  Change habits associated with smoking. If you smoked while driving or when feeling stressed, try other activities to replace smoking. Stand up when drinking your coffee. Brush your teeth after eating. Sit in a different chair when you read the paper. Avoid alcohol while trying to quit, and try to drink fewer caffeinated beverages. Alcohol and caffeine may urge you to smoke.  Avoid foods and drinks that can trigger a desire to smoke, such as sugary or spicy foods and alcohol.  Ask people who smoke not to smoke around you.  Have something planned to do right after eating or having a cup of coffee. For example, plan to take a walk or exercise.  Try a relaxation exercise to calm you down and decrease your stress. Remember, you may be tense and nervous for the first 2 weeks after you quit, but this will pass.  Find new activities to keep your hands busy. Play with a pen, coin, or rubber band. Doodle or draw things on paper.  Brush your teeth right after eating. This will help cut down on the craving for the taste of tobacco after meals. You can also try mouthwash.   Use  oral substitutes in place of cigarettes. Try using lemon drops, carrots, cinnamon sticks, or chewing gum. Keep them handy so they are available when you have the urge to smoke.  When you have the urge to smoke, try deep breathing.  Designate your home as a nonsmoking area.  If you are a heavy smoker, ask your health care provider about a prescription for nicotine chewing gum. It can ease your withdrawal from nicotine.  Reward yourself. Set aside the cigarette money you save and buy yourself something nice.  Look for support from others. Join a support group or smoking cessation program. Ask someone at home or at work to help you with your plan to quit smoking.  Always ask yourself, "Do I need this cigarette or is this just a reflex?" Tell yourself, "Today, I choose not to smoke," or "I do not want to smoke." You are reminding yourself of your decision to quit.  Do not replace cigarette smoking with  electronic cigarettes (commonly called e-cigarettes). The safety of e-cigarettes is unknown, and some may contain harmful chemicals.  If you relapse, do not give up! Plan ahead and think about what you will do the next time you get the urge to smoke. HOW WILL I FEEL WHEN I QUIT SMOKING? You may have symptoms of withdrawal because your body is used to nicotine (the addictive substance in cigarettes). You may crave cigarettes, be irritable, feel very hungry, cough often, get headaches, or have difficulty concentrating. The withdrawal symptoms are only temporary. They are strongest when you first quit but will go away within 10-14 days. When withdrawal symptoms occur, stay in control. Think about your reasons for quitting. Remind yourself that these are signs that your body is healing and getting used to being without cigarettes. Remember that withdrawal symptoms are easier to treat than the major diseases that smoking can cause.  Even after the withdrawal is over, expect periodic urges to smoke. However,  these cravings are generally short lived and will go away whether you smoke or not. Do not smoke! WHAT RESOURCES ARE AVAILABLE TO HELP ME QUIT SMOKING? Your health care provider can direct you to community resources or hospitals for support, which may include:  Group support.  Education.  Hypnosis.  Therapy.   This information is not intended to replace advice given to you by your health care provider. Make sure you discuss any questions you have with your health care provider.   Document Released: 07/06/2004 Document Revised: 10/29/2014 Document Reviewed: 03/26/2013 Elsevier Interactive Patient Education 2016 [employer].    Medications discontinued today:  Medications Discontinued During This Encounter  Medication Reason  . ARIPiprazole (ABILIFY) 10 MG tablet Error  . acetaminophen-codeine (TYLENOL #3) 300-30 MG tablet Error  . baclofen (LIORESAL) 10 MG tablet Error  . buprenorphine (BUTRANS) 5 MCG/HR PTWK patch Error  . citalopram (CELEXA) 20 MG tablet Error  . docusate sodium (COLACE) 100 MG capsule Error  . doxepin (SINEQUAN) 50 MG capsule Error  . gabapentin (NEURONTIN) 300 MG capsule Error  . ibuprofen (ADVIL,MOTRIN) 600 MG tablet Error  . lamoTRIgine (LAMICTAL) 100 MG tablet Error  . oxycodone (OXY-IR) 5 MG capsule Reorder  . oxycodone (OXY-IR) 5 MG capsule Error  . pregabalin (LYRICA) 150 MG capsule Error  . venlafaxine XR (EFFEXOR-XR) 150 MG 24 hr capsule Error  . oxycodone (OXY-IR) 5 MG capsule Reorder   Medications administered today:  Ms. Mancini had no medications administered during this visit.  Primary Care Physician: [facility] Location: [facility] Note by: Sydnee Levans. Laban Emperor, M.D, DABA, DABAPM, DABPM, DABIPP, FIPP

## 2015-11-21 ENCOUNTER — Other Ambulatory Visit: Payer: Self-pay | Admitting: Pain Medicine

## 2015-11-23 NOTE — Telephone Encounter (Signed)

## 2015-11-24 ENCOUNTER — Ambulatory Visit: Payer: Medicare Other | Attending: Pain Medicine | Admitting: Pain Medicine

## 2015-11-24 ENCOUNTER — Other Ambulatory Visit: Payer: Self-pay | Admitting: Pain Medicine

## 2015-11-24 ENCOUNTER — Encounter: Payer: Self-pay | Admitting: Pain Medicine

## 2015-11-24 VITALS — BP 127/63 | HR 70 | Temp 98.3°F | Resp 16 | Ht 66.0 in | Wt 155.0 lb

## 2015-11-24 DIAGNOSIS — F1190 Opioid use, unspecified, uncomplicated: Secondary | ICD-10-CM | POA: Insufficient documentation

## 2015-11-24 DIAGNOSIS — F1290 Cannabis use, unspecified, uncomplicated: Secondary | ICD-10-CM | POA: Diagnosis not present

## 2015-11-24 DIAGNOSIS — M797 Fibromyalgia: Secondary | ICD-10-CM

## 2015-11-24 DIAGNOSIS — Z9889 Other specified postprocedural states: Secondary | ICD-10-CM | POA: Diagnosis not present

## 2015-11-24 DIAGNOSIS — M549 Dorsalgia, unspecified: Secondary | ICD-10-CM | POA: Diagnosis present

## 2015-11-24 DIAGNOSIS — J439 Emphysema, unspecified: Secondary | ICD-10-CM | POA: Diagnosis not present

## 2015-11-24 DIAGNOSIS — G8929 Other chronic pain: Secondary | ICD-10-CM | POA: Diagnosis not present

## 2015-11-24 DIAGNOSIS — M5416 Radiculopathy, lumbar region: Secondary | ICD-10-CM | POA: Diagnosis not present

## 2015-11-24 DIAGNOSIS — F339 Major depressive disorder, recurrent, unspecified: Secondary | ICD-10-CM | POA: Diagnosis not present

## 2015-11-24 DIAGNOSIS — M79605 Pain in left leg: Secondary | ICD-10-CM | POA: Diagnosis not present

## 2015-11-24 DIAGNOSIS — E559 Vitamin D deficiency, unspecified: Secondary | ICD-10-CM | POA: Insufficient documentation

## 2015-11-24 DIAGNOSIS — J449 Chronic obstructive pulmonary disease, unspecified: Secondary | ICD-10-CM | POA: Insufficient documentation

## 2015-11-24 DIAGNOSIS — M542 Cervicalgia: Secondary | ICD-10-CM | POA: Insufficient documentation

## 2015-11-24 DIAGNOSIS — M79604 Pain in right leg: Secondary | ICD-10-CM | POA: Diagnosis not present

## 2015-11-24 DIAGNOSIS — M545 Low back pain: Secondary | ICD-10-CM | POA: Insufficient documentation

## 2015-11-24 DIAGNOSIS — M792 Neuralgia and neuritis, unspecified: Secondary | ICD-10-CM

## 2015-11-24 DIAGNOSIS — M5417 Radiculopathy, lumbosacral region: Secondary | ICD-10-CM

## 2015-11-24 DIAGNOSIS — Z5181 Encounter for therapeutic drug level monitoring: Secondary | ICD-10-CM | POA: Diagnosis not present

## 2015-11-24 DIAGNOSIS — F17210 Nicotine dependence, cigarettes, uncomplicated: Secondary | ICD-10-CM | POA: Diagnosis not present

## 2015-11-24 DIAGNOSIS — Z79891 Long term (current) use of opiate analgesic: Secondary | ICD-10-CM

## 2015-11-24 DIAGNOSIS — G9589 Other specified diseases of spinal cord: Secondary | ICD-10-CM | POA: Insufficient documentation

## 2015-11-24 DIAGNOSIS — M79606 Pain in leg, unspecified: Secondary | ICD-10-CM | POA: Diagnosis not present

## 2015-11-24 HISTORY — DX: Encounter for therapeutic drug level monitoring: Z51.81

## 2015-11-24 MED ORDER — OXYCODONE HCL 5 MG PO TABS: 5.0000 mg | ORAL_TABLET | Freq: Four times a day (QID) | ORAL | Status: DC | PRN

## 2015-11-24 MED ORDER — PREGABALIN 150 MG PO CAPS: 150.0000 mg | ORAL_CAPSULE | Freq: Three times a day (TID) | ORAL | Status: DC

## 2015-11-24 NOTE — Progress Notes (Signed)
Safety precautions to be maintained throughout the outpatient stay will include: orient to surroundings, keep bed in low position, maintain call bell within reach at all times, provide assistance with transfer out of bed and ambulation.  Pills remaining  22/120 oxycodone   Filled 10/27/15                           37/90 Lyrica  Filled 10/26/15

## 2015-11-25 NOTE — Progress Notes (Signed)
Patient's Name: Diane Knapp MRN: [id number] DOB: 11-May-1966 DOS: 11/24/2015  Primary Reason(s) for Visit: Encounter for Medication Management CC: Back Pain   HPI  Diane Knapp is a 50 y.o. year old, female patient, who returns today as an established patient. 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); Chronic upper extremity pain (Bilateral) (R>L); Chronic pain of lower extremity (Bilateral) (L>R); Diffuse myofascial pain syndrome; Substance use disorder Risk: HIGH; Marijuana use; Illicit drug use; Noncompliance; Major depressive disorder (HCC); Emphysema of lung (HCC); Chronic obstructive pulmonary disease (COPD) (HCC); Generalized anxiety disorder; Failed back surgical syndrome; Chronic low back pain; Chronic lumbar radicular pain (L>R); Bone pain; Chronic cervical radicular pain (Right); Noncompliance with medication treatment due to overuse of medication; Vitamin D deficiency; Hypomagnesemia; Elevated sedimentation rate; Encounter for therapeutic drug level monitoring; and Lumbosacral radiculopathy at L5 (Right) on her problem list.. Her primarily concern today is the Back Pain   The patient returns to the clinics today for pharmacological management of her chronic pain. However, she seems to be having an acute right lower extremity radiculitis over the L5 dermatome.  Reported Pain Score: 8 , clinically she looks like a 4-5/10. Reported level is inconsistent with clinical obrservations. Pain Type: Chronic pain Pain Location: Back Pain Orientation:  Lower Pain Descriptors / Indicators: Aching, Constant, Shooting, Tightness, Tingling (neck is throbbing and stabbing like pins and needles in the neck, the legs have tiingling and tightness) Pain Frequency: Constant  Date of Last Visit: 10/27/15 Service Provided on Last Visit: Evaluation, Med Refill  Pharmacotherapy  Medication(s): Oxycodone IR 5 mg 1 tablet by mouth every 6 hours when necessary for the pain. Onset of action: Within expected pharmacological parameters Time to Peak effect: Timing and results are as within normal expected parameters Analgesic Effect: More than 50% Activity Facilitation: Medication(s) allow patient to sit, stand, walk, and do the basic ADLs Perceived Effectiveness: Described as relatively effective, allowing for increase in activities of daily living (ADL) Side-effects or Adverse reactions: None reported Duration of action: Within normal limits for medication [place] PMP: Compliant with practice rules and regulations UDS Results: The patient's last UDS was done on 10/12/2015 and it came back positive for oxycodone but negative for oxymorphone, nor oxycodone, or nor oxymorphone. These are all metabolites of oxycodone. This type of result could indicate that the patient took some medicine recently for the purpose of testing positive on the UDS. UDS Interpretation: Patient appears to be compliant with practice rules and regulations Medication Assessment Form: Reviewed. Patient indicates being compliant with therapy Treatment compliance: Compliant Substance Use Disorder (SUD) Risk Level: High Pharmacologic Plan: Continue therapy as is  Lab Work: Illicit Drugs No results found for: THCU, COCAINSCRNUR, PCPSCRNUR, MDMA, AMPHETMU, METHADONE, ETOH  Inflammation Markers Lab Results  Component Value Date   ESRSEDRATE 23* 10/13/2015   CRP <0.5 10/13/2015    Renal Function Lab Results  Component Value Date   BUN 9 10/13/2015   CREATININE 0.75 10/13/2015   GFRAA  >60 10/13/2015   GFRNONAA >60 10/13/2015    Hepatic Function Lab Results  Component Value Date   AST 17 10/13/2015   ALT 13* 10/13/2015   ALBUMIN 4.3 10/13/2015  Electrolytes Lab Results  Component Value Date   NA 134* 10/13/2015   K 3.1* 10/13/2015   CL 102 10/13/2015   CALCIUM 9.1 10/13/2015   MG 1.8 10/13/2015    Allergies  Diane Knapp is allergic to duloxetine and cymbalta.  Meds  The patient has a current medication list which includes the following prescription(s): aripiprazole, vitamin d3, doxycycline, ofloxacin, oxycodone, pregabalin, and vitamin d (ergocalciferol).  Current Outpatient Prescriptions on File Prior to Visit  Medication Sig  . ARIPiprazole (ABILIFY) 15 MG tablet Take 15 mg by mouth daily.   . Cholecalciferol (VITAMIN D3) 2000 units capsule Take 1 capsule (2,000 Units total) by mouth daily.  . Vitamin D, Ergocalciferol, (DRISDOL) 50000 units CAPS capsule Take 1 capsule (50,000 Units total) by mouth 2 (two) times a week. X 6 weeks.   No current facility-administered medications on file prior to visit.    ROS  Constitutional: Afebrile, no chills, well hydrated and well nourished Gastrointestinal: negative Musculoskeletal:negative Neurological: negative Behavioral/Psych: negative  PFSH  Medical:  Diane Knapp  has a past medical history of Myelomalacia (HCC); Spondylosis; Depression; COPD (chronic obstructive pulmonary disease) (HCC); Anxiety; and Callus of foot. Family: family history includes Cancer in her mother; Diabetes in her mother; Hypertension in her mother; Stroke in her mother. Surgical:  has past surgical history that includes Spine surgery and Right oophorectomy. Tobacco:  reports that she has been smoking Cigarettes.  She has been smoking about 1.00 pack per day. She does not have any smokeless tobacco history on file. Alcohol:  reports that she does not drink alcohol. Drug:  reports that she does not use illicit drugs.  Physical  Exam  Vitals:  Today's Vitals   11/24/15 1506 11/24/15 1507  BP: 127/63   Pulse: 70   Temp: 98.3 F (36.8 C)   TempSrc: Oral   Resp: 16   Height: 5\' 6"  (1.676 m)   Weight: 155 lb (70.308 kg)   SpO2: 100%   PainSc: 8  8   PainLoc: Back     Calculated BMI: Body mass index is 25.03 kg/(m^2).  General appearance: alert, cooperative, appears stated age and mild distress Eyes: PERLA Respiratory: No evidence respiratory distress, no audible rales or ronchi and no use of accessory muscles of respiration  Cervical Spine Inspection: Normal anatomy Alignment: Symetrical ROM: Adequate  Upper Extremities Inspection: No gross anomalies detected ROM: Adequate Sensory: Normal Motor: Unremarkable  Thoracic Spine Inspection: No gross anomalies detected Alignment: Symetrical ROM: Adequate Palpation: WNL  Lumbar Spine Inspection: No gross anomalies detected Alignment: Symetrical ROM: Adequate Palpation: WNL Provocative Tests:  Lumbar Hyperextension and rotation test:  deferred Patrick's Maneuver: deferred Gait: Antalgic (limping)  Lower Extremities Inspection: No gross anomalies detected ROM: Decreased right foot dorsiflexion. Sensory:  Dysesthesias and pain over the right lower extremity going to the top of the foot in what seems to be the L5 dermatomal distribution. Motor: Unremarkable  Toe walk (S1): WNL  Heal walk (L5): Weakness on the right side Pulses: Palpable DTR:  Patellar (L4): WNL Achilles (S1): WNL  Assessment & Plan  Primary Diagnosis & Pertinent Problem List: The primary encounter diagnosis was Lumbosacral radiculopathy at L5 (Right). Diagnoses of Chronic pain, Encounter for therapeutic drug level monitoring, Long term current use of opiate analgesic, Fibromyalgia, Chronic pain of lower extremity, unspecified laterality, and Neuropathic pain were also pertinent to this visit.  Visit Diagnosis: 1. Lumbosacral radiculopathy at L5 (Right)   2. Chronic pain    3. Encounter for therapeutic drug  level monitoring   4. Long term current use of opiate analgesic   5. Fibromyalgia   6. Chronic pain of lower extremity, unspecified laterality   7. Neuropathic pain     Assessment: No problem-specific assessment & plan notes found for this encounter.   Plan of Care  Pharmacotherapy (Medications Ordered): Meds ordered this encounter  Medications  . oxyCODONE (OXY IR/ROXICODONE) 5 MG immediate release tablet    Sig: Take 1 tablet (5 mg total) by mouth every 6 (six) hours as needed for moderate pain or severe pain.    Dispense:  120 tablet    Refill:  0    Do not place this medication, or any other prescription from our practice, on "Automatic Refill". Patient may have prescription filled one day early if pharmacy is closed on scheduled refill date. Do not fill until: 11/26/15 To last until: 12/26/15  . pregabalin (LYRICA) 150 MG capsule    Sig: Take 1 capsule (150 mg total) by mouth every 8 (eight) hours.    Dispense:  90 capsule    Refill:  5    Do not place this medication, or any other prescription from our practice, on "Automatic Refill". Patient may have prescription filled one day early if pharmacy is closed on scheduled refill date. Do not fill until: 11/26/15 To last until: 05/23/16    [facility] & Procedure Ordered: Orders Placed This Encounter  Procedures  . MR Lumbar Spine Wo Contrast    Standing Status: Future     Number of Occurrences:      Standing Expiration Date: 11/23/2016    Scheduling Instructions:     Please provide canal diameter in millimeters when describing any spinal stenosis.    Order Specific Question:  Reason for Exam (SYMPTOM  OR DIAGNOSIS REQUIRED)    Answer:  Lumbar radiculopathy/radiculitis (right L5 radiculopathy with drop foot)    Order Specific Question:  Preferred imaging location?    Answer:  [facility]    Order Specific Question:  Does the patient have a pacemaker or implanted devices?    Answer:   No    Order Specific Question:  What is the patient's sedation requirement?    Answer:  No Sedation    Order Specific Question:  Call Results- Best Contact Number?    Answer:  [phone] (Pain Clinic facility) (Dr. Laban Emperor)    Imaging Ordered: MR LUMBAR SPINE WO CONTRAST  Interventional Therapies: Scheduled: Depending on the results of the lumbar MRI we may order a lumbar epidural steroid injection under fluoroscopic guidance. PRN Procedures: Possible right-sided L4-5 lumbar epidural steroid injection under fluoroscopic guidance.    Referral(s) or Consult(s): None at this point.  Medications administered during this visit: Ms. Hundal had no medications administered during this visit.  Future Appointments Date Time Provider Department Center  12/15/2015 1:00 PM [facility] 2 [facility]  12/22/2015 2:00 PM Delano Metz, MD [facility] None    Primary Care Physician: Phineas Real Community Location: [facility] Out[facility] Pain [facility] Facility Note by: Sydnee Levans. Laban Emperor, M.D, DABA, DABAPM, DABPM, DABIPP, FIPP

## 2015-12-02 LAB — TOXASSURE SELECT 13 (MW), URINE: PDF: 0

## 2015-12-03 ENCOUNTER — Encounter: Payer: Self-pay | Admitting: Pain Medicine

## 2015-12-03 NOTE — Progress Notes (Signed)
Quick Note:  Positive, undisclosed use of illicit substance (Cannabinoids). Our program has a "Zero Tolerance" for the use of illicit substances. As a consequence of the results obtained on this test, we will no longer offer controlled substances as a therapeutic option for this patient, until the UDS return clear. In addition, we will check to see if this is a recurrent event. Today the patient will be given a final warning and informed that should this event happen again, we will no longer continue to offer opioids as an alternative treatment option. In the event that our records reveal that this warning had previously been given to the patient, we will then immediately discontinue this mode of therapy. ______ 

## 2015-12-15 ENCOUNTER — Ambulatory Visit
Admission: RE | Admit: 2015-12-15 | Discharge: 2015-12-15 | Disposition: A | Discharge status: Home / Self Care | Payer: Medicare Other | Source: Ambulatory Visit | Attending: Pain Medicine | Admitting: Pain Medicine

## 2015-12-15 DIAGNOSIS — M21371 Foot drop, right foot: Secondary | ICD-10-CM | POA: Insufficient documentation

## 2015-12-15 DIAGNOSIS — M4806 Spinal stenosis, lumbar region: Secondary | ICD-10-CM | POA: Diagnosis not present

## 2015-12-15 DIAGNOSIS — M5417 Radiculopathy, lumbosacral region: Secondary | ICD-10-CM | POA: Insufficient documentation

## 2015-12-15 DIAGNOSIS — M8938 Hypertrophy of bone, other site: Secondary | ICD-10-CM | POA: Insufficient documentation

## 2015-12-15 DIAGNOSIS — M47816 Spondylosis without myelopathy or radiculopathy, lumbar region: Secondary | ICD-10-CM | POA: Diagnosis not present

## 2015-12-20 NOTE — Progress Notes (Signed)
Quick Note:  The results of this diagnostic imaging were reviewed and found to be within normal limits. No acute injury or pathology identified. There appears to be no major pathology requiring urgent or emergency care.  IMPRESSION: Minimal lumbar spondylosis and mild facet hypertrophy resulting in mild bilateral lateral recess stenosis at L3-4 and L4-5.  Treatment alternatives: Diagnostic lumbar facet blocks for the low back pain versus bilateral L3-4 and/or L4-5 transforaminal epidural steroid injections under fluoroscopic guidance and IV sedation. ______

## 2015-12-22 ENCOUNTER — Ambulatory Visit: Payer: Medicare Other | Attending: Pain Medicine | Admitting: Pain Medicine

## 2015-12-22 ENCOUNTER — Encounter: Payer: Self-pay | Admitting: Pain Medicine

## 2015-12-22 VITALS — BP 127/74 | HR 70 | Temp 98.3°F | Resp 18 | Ht 66.0 in | Wt 151.9 lb

## 2015-12-22 DIAGNOSIS — M79602 Pain in left arm: Secondary | ICD-10-CM | POA: Diagnosis not present

## 2015-12-22 DIAGNOSIS — F411 Generalized anxiety disorder: Secondary | ICD-10-CM | POA: Insufficient documentation

## 2015-12-22 DIAGNOSIS — M797 Fibromyalgia: Secondary | ICD-10-CM | POA: Insufficient documentation

## 2015-12-22 DIAGNOSIS — G9589 Other specified diseases of spinal cord: Secondary | ICD-10-CM | POA: Insufficient documentation

## 2015-12-22 DIAGNOSIS — M79601 Pain in right arm: Secondary | ICD-10-CM | POA: Diagnosis not present

## 2015-12-22 DIAGNOSIS — M5417 Radiculopathy, lumbosacral region: Secondary | ICD-10-CM | POA: Insufficient documentation

## 2015-12-22 DIAGNOSIS — Z91199 Patient's noncompliance with other medical treatment and regimen due to unspecified reason: Secondary | ICD-10-CM

## 2015-12-22 DIAGNOSIS — F1990 Other psychoactive substance use, unspecified, uncomplicated: Secondary | ICD-10-CM

## 2015-12-22 DIAGNOSIS — Z9114 Patient's other noncompliance with medication regimen: Secondary | ICD-10-CM | POA: Insufficient documentation

## 2015-12-22 DIAGNOSIS — F1210 Cannabis abuse, uncomplicated: Secondary | ICD-10-CM

## 2015-12-22 DIAGNOSIS — Z79891 Long term (current) use of opiate analgesic: Secondary | ICD-10-CM | POA: Diagnosis not present

## 2015-12-22 DIAGNOSIS — J439 Emphysema, unspecified: Secondary | ICD-10-CM | POA: Insufficient documentation

## 2015-12-22 DIAGNOSIS — M47812 Spondylosis without myelopathy or radiculopathy, cervical region: Secondary | ICD-10-CM | POA: Insufficient documentation

## 2015-12-22 DIAGNOSIS — Z981 Arthrodesis status: Secondary | ICD-10-CM | POA: Diagnosis not present

## 2015-12-22 DIAGNOSIS — F17210 Nicotine dependence, cigarettes, uncomplicated: Secondary | ICD-10-CM | POA: Insufficient documentation

## 2015-12-22 DIAGNOSIS — G8929 Other chronic pain: Secondary | ICD-10-CM | POA: Diagnosis not present

## 2015-12-22 DIAGNOSIS — M549 Dorsalgia, unspecified: Secondary | ICD-10-CM | POA: Diagnosis present

## 2015-12-22 DIAGNOSIS — J449 Chronic obstructive pulmonary disease, unspecified: Secondary | ICD-10-CM | POA: Insufficient documentation

## 2015-12-22 DIAGNOSIS — F339 Major depressive disorder, recurrent, unspecified: Secondary | ICD-10-CM | POA: Diagnosis not present

## 2015-12-22 DIAGNOSIS — F1290 Cannabis use, unspecified, uncomplicated: Secondary | ICD-10-CM | POA: Diagnosis not present

## 2015-12-22 DIAGNOSIS — F1190 Opioid use, unspecified, uncomplicated: Secondary | ICD-10-CM | POA: Diagnosis not present

## 2015-12-22 DIAGNOSIS — E559 Vitamin D deficiency, unspecified: Secondary | ICD-10-CM | POA: Insufficient documentation

## 2015-12-22 DIAGNOSIS — M545 Low back pain: Secondary | ICD-10-CM | POA: Insufficient documentation

## 2015-12-22 DIAGNOSIS — Z9119 Patient's noncompliance with other medical treatment and regimen: Secondary | ICD-10-CM

## 2015-12-22 MED ORDER — PREGABALIN 150 MG PO CAPS: 150.0000 mg | ORAL_CAPSULE | Freq: Three times a day (TID) | ORAL | Status: DC

## 2015-12-22 NOTE — Assessment & Plan Note (Signed)
Previously found to have a UDS positive for unreported use of ETOH and cannabinoids on 12/07/2013. Patient previously discharged from [facility] pain clinic secondary to missing appointments. 10/12/2015 - patient is taking more medication than prescribed. 12/22/2015 - opioid analgesic pain management treatment option terminated today due to noncompliance with medication policy and signed medication agreement. Patient tested positive for cannabinoids on the 11/24/2015 UDS. [facility].

## 2015-12-22 NOTE — Assessment & Plan Note (Signed)
Patient previously discharged from [facility] pain clinic secondary to missing appointments. 10/12/2015 - patient is taking more medication than prescribed. 12/22/2015 - opioid analgesic pain management treatment option terminated today due to noncompliance with medication policy and signed medication agreement. Patient tested positive for cannabinoids on the 11/24/2015 UDS. [facility].

## 2015-12-22 NOTE — Progress Notes (Signed)
Safety precautions to be maintained throughout the outpatient stay will include: orient to surroundings, keep bed in low position, maintain call bell within reach at all times, provide assistance with transfer out of bed and ambulation.  Pt had MRI on 12/15/15 Neck and back- in Epic to review Pills remaining 22/120 oxycodone  filled 11/28/15

## 2015-12-22 NOTE — Progress Notes (Signed)
Patient's Name: Diane Knapp MRN: [id number] DOB: 22-Oct-1966 DOS: 12/22/2015  Primary Reason(s) for Visit: Encounter for Medication Management CC: Back Pain   HPI  Diane Knapp is a 50 y.o. year old, female patient, who returns today as an established patient. 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); Chronic upper extremity pain (Bilateral) (R>L); Chronic pain of lower extremity (Bilateral) (L>R); Diffuse myofascial pain syndrome; Substance use disorder Risk: HIGH; Marijuana use (see 11/24/2015 UDS); Illicit drug use (see 11/24/2015 UDS); Noncompliance; Major depressive disorder (HCC); Emphysema of lung (HCC); Chronic obstructive pulmonary disease (COPD) (HCC); Generalized anxiety disorder; Failed back surgical syndrome; Chronic low back pain; Chronic lumbar radicular pain (L>R); Bone pain; Chronic cervical radicular pain (Right); Noncompliance with medication treatment due to overuse of medication; Vitamin D deficiency; Hypomagnesemia; Elevated sedimentation rate; Encounter for therapeutic drug level monitoring; and Lumbosacral radiculopathy at L5 (Right) on her problem list.. Her primarily concern today is the Back Pain   The patient returns to the clinics today for pharmacological management of her chronic pain. Unfortunately, her last UDS done on 11/24/2015 came back positive for cannabinoids. The one before that on 10/12/2015 came back abnormal with a significant amount of oxycodone in no metabolites. This is extremely rare and it may be  secondary to "pill dropping". They UDS before that done on 08/31/2015 came back abnormal with the presence of unreported alprazolam, which upon checking the [place] PMP it turns out that nobody has been prescribing that to her. In view of these anomalies and the fact that the patient had been previously warned about the use of any illegal substances in combination with our controlled substances, today I have informed the patient that I will be changing her treatment plan to now exclude the use of any opioid controlled substances. Today I wrote a prescription for her Lyrica to last her for 6 months and we will see her at the point for refills.  Pain Assessment: Self-Reported Pain Score: 8 , clinically she looks like a 3/10. Clear symptom exaggeration. Reported level of pain is incompatible with clinical observations Pain Location: Back Pain Orientation: Lower Pain Descriptors / Indicators: Aching, Constant, Tingling, Shooting Pain Frequency: Constant  Date of Last Visit: 11/24/15 Service Provided on Last Visit: Med Refill  Controlled Substance Pharmacotherapy Assessment  Analgesic: Oxycodone IR 5 mg 1 tablet by mouth every 6 hours when necessary for pain (20 mg/day) MME/day: 30 mg/day Pharmacokinetics: Onset of action (Liberation/Absorption): Within expected pharmacological parameters Time to Peak effect (Distribution): Timing and results are as within normal expected parameters Duration of action (Metabolism/Excretion): Within normal limits for medication Pharmacodynamics: Analgesic Effect: More than 50% Activity Facilitation: Medication(s) allow patient to sit, stand, walk, and do the basic ADLs Perceived Effectiveness: Described as relatively effective, allowing for increase in activities of daily living (ADL) Side-effects or Adverse reactions: None reported Monitoring: [place] PMP: Compliant with practice rules and regulations UDS Results/interpretation: Last UDS done on 11/24/2015 came back  abnormal due to the presence of unreported cannabinoids. Medication Assessment Form: Discrepancies found between patient's report and information collected. The patient did not report to use of illegal substances. Treatment compliance: Noncompliant Risk Assessment: Aberrant Behavior:  use of illicit substances, non-compliance with medical instructions on the proper use of the medication and non-compliance with practice rules and regulations Substance Use Disorder (SUD) Risk Level: Very high Opioid Risk Tool (ORT) Score: Total Score: 1    Depression Scale Score: PHQ-2: PHQ-2 Total Score: 0 PHQ-9: PHQ-9 Total Score: 0  Pharmacologic Plan: Discontinue therapy  Lab Work: Illicit Drugs No results found for: THCU, COCAINSCRNUR, PCPSCRNUR, MDMA, AMPHETMU, METHADONE, ETOH  Inflammation Markers Lab Results  Component Value Date   ESRSEDRATE 23* 10/13/2015   CRP <0.5 10/13/2015    Renal Function Lab Results  Component Value Date   BUN 9 10/13/2015   CREATININE 0.75 10/13/2015   GFRAA >60 10/13/2015   GFRNONAA >60 10/13/2015    Hepatic Function Lab Results  Component Value Date   AST 17 10/13/2015   ALT 13* 10/13/2015   ALBUMIN 4.3 10/13/2015    Electrolytes Lab Results  Component Value Date   NA 134* 10/13/2015   K 3.1* 10/13/2015   CL 102 10/13/2015   CALCIUM 9.1 10/13/2015   MG 1.8 10/13/2015    Allergies  Diane Knapp is allergic to duloxetine and cymbalta.  Meds  The patient has a current medication list which includes the following prescription(s): aripiprazole, vitamin d3, doxycycline, ofloxacin, oxycodone, and pregabalin.  Current Outpatient Prescriptions on File Prior to Visit  Medication Sig  . ARIPiprazole (ABILIFY) 15 MG tablet Take 15 mg by mouth daily.   . Cholecalciferol (VITAMIN D3) 2000 units capsule Take 1 capsule (2,000 Units total) by mouth daily.  Marland Kitchen doxycycline (VIBRA-TABS) 100 MG tablet take 1 tablet by mouth once daily for 10 days  . ofloxacin  (OCUFLOX) 0.3 % ophthalmic solution instill 1 drop into right eye three times a day for 10 days  . oxyCODONE (OXY IR/ROXICODONE) 5 MG immediate release tablet Take 1 tablet (5 mg total) by mouth every 6 (six) hours as needed for moderate pain or severe pain.   No current facility-administered medications on file prior to visit.    ROS  Constitutional: Afebrile, no chills, well hydrated and well nourished Gastrointestinal: negative Musculoskeletal:negative Neurological: negative Behavioral/Psych: negative  PFSH  Medical:  Diane Knapp  has a past medical history of Myelomalacia (HCC); Spondylosis; Depression; COPD (chronic obstructive pulmonary disease) (HCC); Anxiety; and Callus of foot. Family: family history includes Cancer in her mother; Diabetes in her mother; Hypertension in her mother; Stroke in her mother. Surgical:  has past surgical history that includes Spine surgery and Right oophorectomy. Tobacco:  reports that she has been smoking Cigarettes.  She has been smoking about 1.00 pack per day. She does not have any smokeless tobacco history on file. Alcohol:  reports that she drinks alcohol. Drug:  reports that she does not use illicit drugs.  Physical Exam  Vitals:  Today's Vitals   12/22/15 1410 12/22/15 1412  BP: 127/74   Pulse: 70   Temp: 98.3 F (36.8 C)   TempSrc: Oral   Resp: 18   Height: 5\' 6"  (1.676 m)   Weight: 151 lb 14.4 oz (68.901 kg)   SpO2: 100%   PainSc:  8     Calculated BMI: Body mass index is 24.53 kg/(m^2).  General appearance: alert, appears stated age, no distress and continued evidence of her tremor. Eyes: PERLA Respiratory: No evidence respiratory distress, no audible rales or ronchi and no use of accessory muscles of respiration  Cervical Spine Inspection: Normal anatomy Alignment: Symetrical ROM: Adequate  Upper Extremities Inspection: No gross anomalies detected  ROM: Adequate Sensory: Normal Motor: Unremarkable  Thoracic  Spine Inspection: No gross anomalies detected Alignment: Symetrical ROM: Adequate  Lumbar Spine Inspection: No gross anomalies detected Alignment: Symetrical ROM: Adequate  Gait: WNL  Lower Extremities Inspection: No gross anomalies detected ROM: Adequate Sensory:  Normal Motor: Unremarkable  Assessment & Plan  Primary Diagnosis & Pertinent Problem List: The primary encounter diagnosis was Fibromyalgia. Diagnoses of Opiate use (30 MME/Day), Illicit drug use (see 11/24/2015 UDS), Marijuana use (see 11/24/2015 UDS), Noncompliance, and Substance use disorder Risk: HIGH were also pertinent to this visit.  Visit Diagnosis: 1. Fibromyalgia   2. Opiate use (30 MME/Day)   3. Illicit drug use (see 11/24/2015 UDS)   4. Marijuana use (see 11/24/2015 UDS)   5. Noncompliance   6. Substance use disorder Risk: HIGH     Problem-specific Plan(s): Noncompliance Patient previously discharged from [facility] clinic secondary to missing appointments. 10/12/2015 - patient is taking more medication than prescribed. 12/22/2015 - opioid analgesic pain management treatment option terminated today due to noncompliance with medication policy and signed medication agreement. Patient tested positive for cannabinoids on the 11/24/2015 UDS. [facility].  Substance use disorder Risk: HIGH Previously found to have a UDS positive for unreported use of ETOH and cannabinoids on 12/07/2013. Patient previously discharged from [facility] pain clinic secondary to missing appointments. 10/12/2015 - patient is taking more medication than prescribed. 12/22/2015 - opioid analgesic pain management treatment option terminated today due to noncompliance with medication policy and signed medication agreement. Patient tested positive for cannabinoids on the 11/24/2015 UDS. [facility].    Plan of Care  Pharmacotherapy (Medications Ordered): Meds ordered this encounter   Medications  . pregabalin (LYRICA) 150 MG capsule    Sig: Take 1 capsule (150 mg total) by mouth every 8 (eight) hours.    Dispense:  90 capsule    Refill:  5    Do not place this medication, or any other prescription from our practice, on "Automatic Refill". Patient may have prescription filled one day early if pharmacy is closed on scheduled refill date.    Lab-work & Procedure Ordered: No orders of the defined types were placed in this encounter.    Imaging Ordered: None  Interventional Therapies: Scheduled: None at this time. PRN Procedures: None at this time.    Referral(s) or Consult(s): None at this time.  Medications administered during this visit: Diane Knapp had no medications administered during this visit.  Future Appointments Date Time Provider Department Center  06/20/2016 1:00 PM Delano Metz, MD [facility] None    Primary Care Physician: [facility] Location: [facility] Outpatient Pain Management Facility Note by: Sydnee Levans. Laban Emperor, M.D, DABA, DABAPM, DABPM, DABIPP, FIPP  Pain Score Disclaimer: We use the NRS-11 scale. This is a self-reported, subjective measurement of pain severity with only modest accuracy. It is used primarily to identify changes within a particular patient. It must be understood that outpatient pain scales are significantly less accurate that those used for research, where they can be applied under ideal controlled circumstances with minimal exposure to variables. In reality, the score is likely to be a combination of pain intensity and pain affect, where pain affect describes the degree of emotional arousal or changes in action readiness caused by the sensory experience of pain. Factors such as social and work situation, setting, emotional state, anxiety levels, expectation, and prior pain experience may influence pain perception and show large inter-individual differences that may also be  affected by time variables.

## 2016-06-20 ENCOUNTER — Encounter: Payer: Medicare Other | Admitting: Pain Medicine

## 2017-08-11 IMAGING — MR MR LUMBAR SPINE W/O CM
4 of 5 series · 15 of 48 positions shown · non-contrast
Comparison: Lumbar spine radiographs 01/30/2011

CLINICAL DATA: Lumbosacral radiculopathy. Low back pain with pain
in the legs. Right L5 radiculopathy with drop foot.

EXAM:
MRI LUMBAR SPINE WITHOUT CONTRAST
TECHNIQUE: Multiplanar, multisequence MR imaging of the lumbar spine was
performed. No intravenous contrast was administered.

[Series 3: T2 · sagittal · 4.0mm · 0.44mm/px · 6 of 17 slices shown (1 of 2)]
[im 1/17]
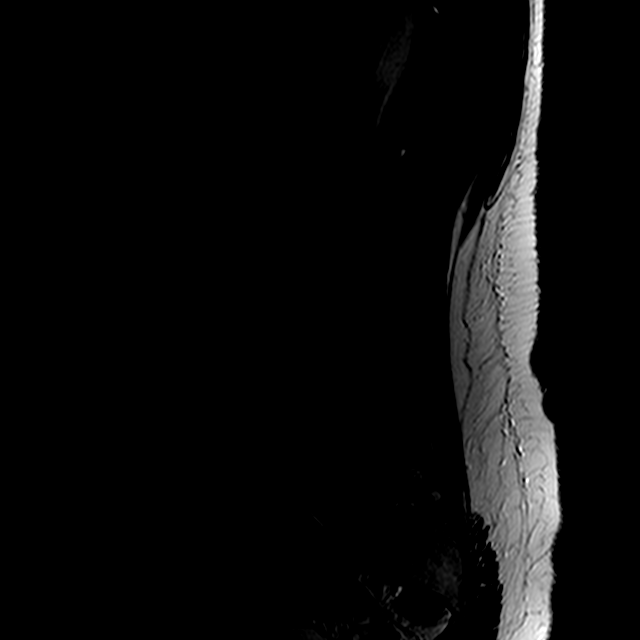
[im 4/17]
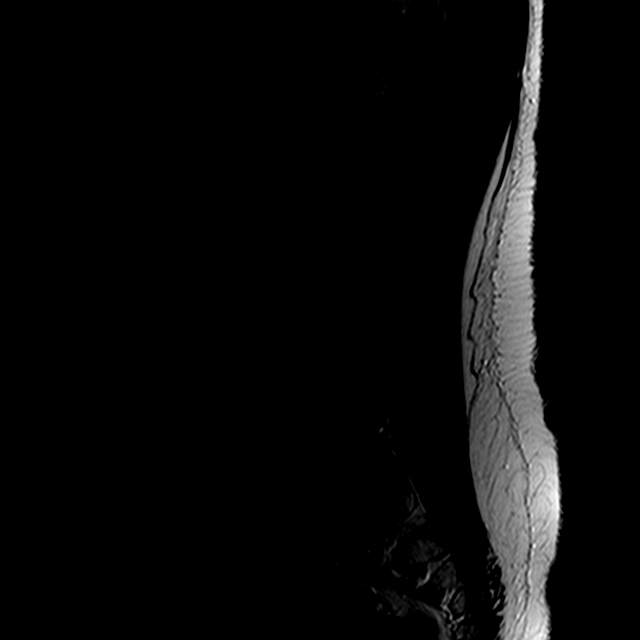
[im 7/17]
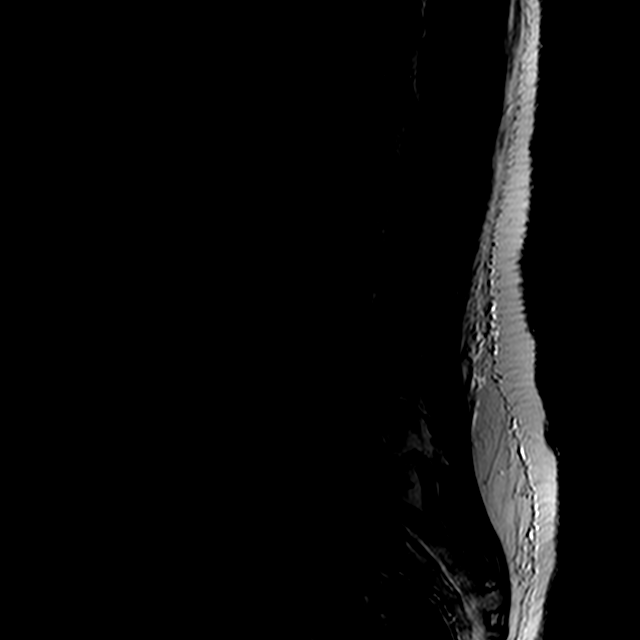
[im 10/17]
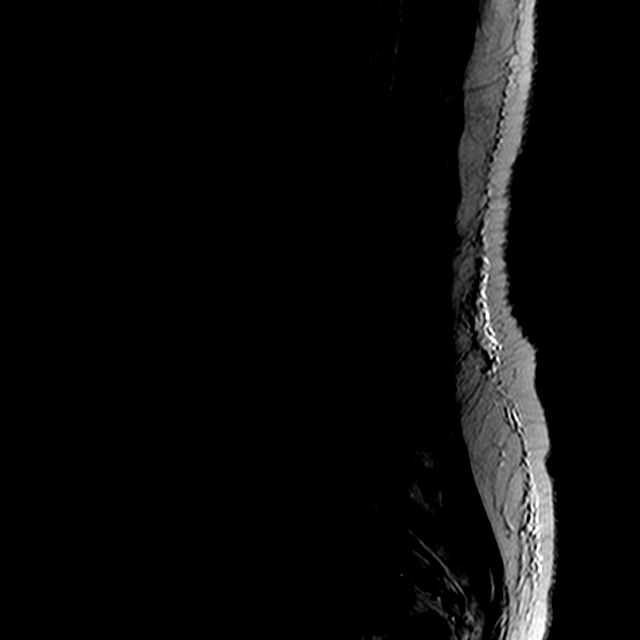
[im 13/17]
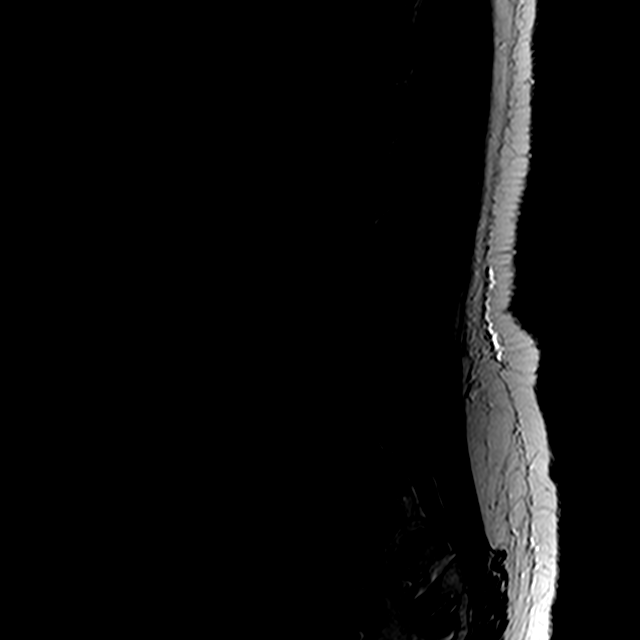
[im 17/17]
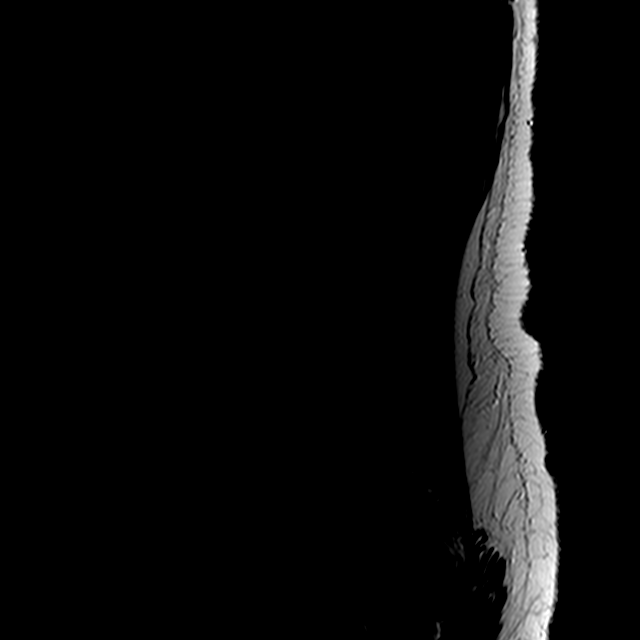

[Series 4: T1 · sagittal · 4.0mm · 0.44mm/px · 3 of 17 slices shown (1 of 2)]
[im 3/17]
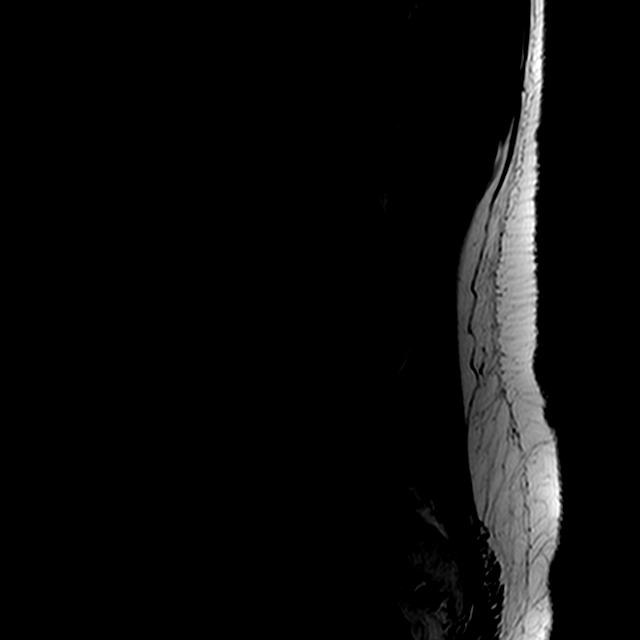
[im 9/17]
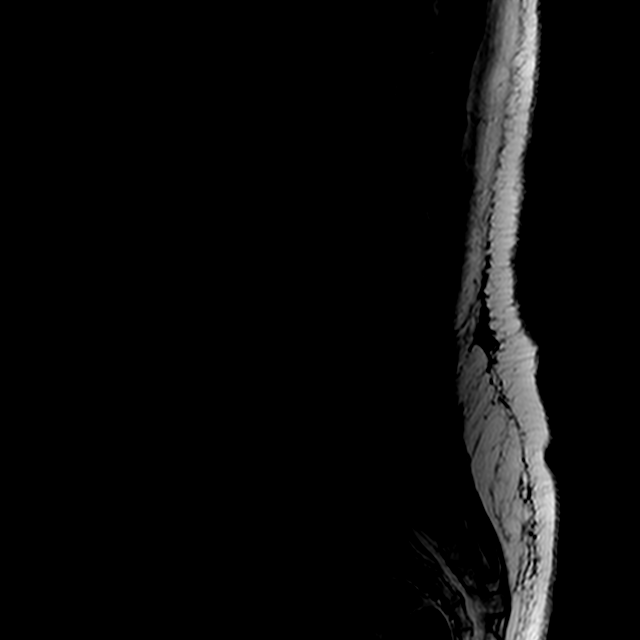
[im 14/17]
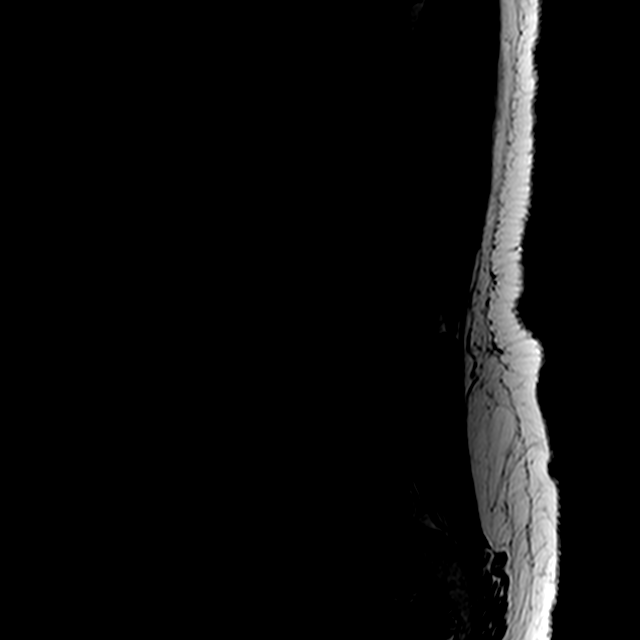

[Series 6: T2 · axial · 4.0mm · 0.39mm/px · z∈[-147,+33]mm · 3 of 37 slices shown (2 of 2)]
[im 6/37]
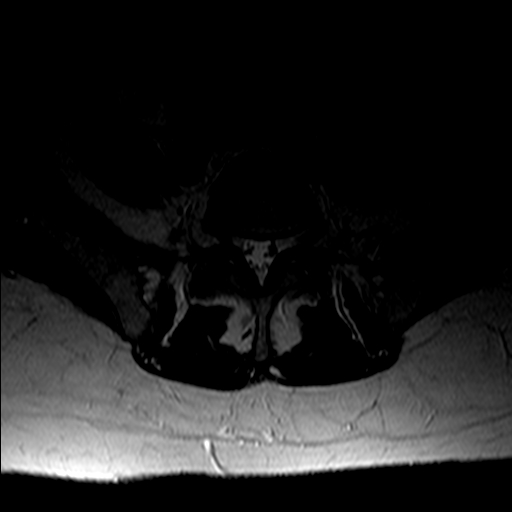
[im 20/37]
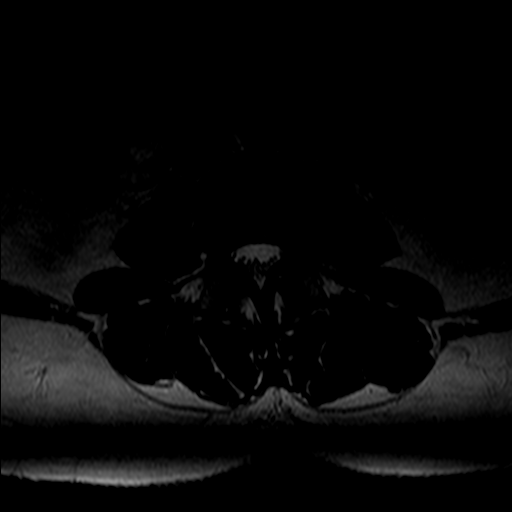
[im 31/37]
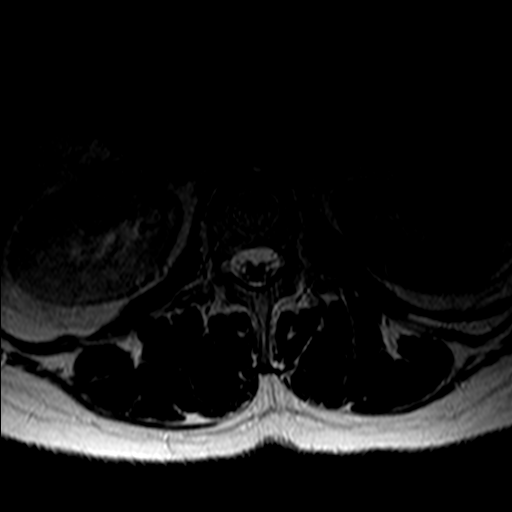

[Series 7: T1 · axial · 4.0mm · 0.39mm/px · z∈[-147,+33]mm · 3 of 37 slices shown (2 of 2)]
[im 6/37]
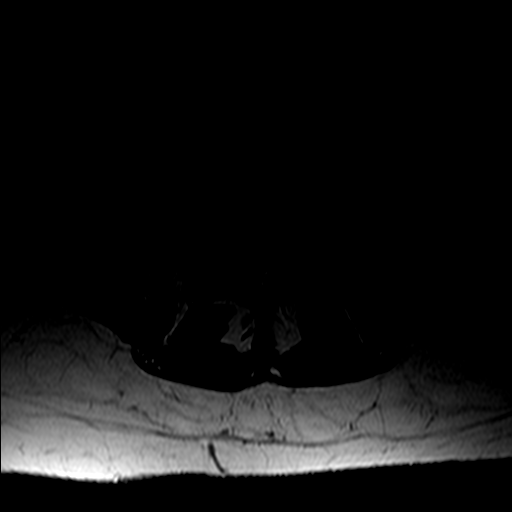
[im 20/37]
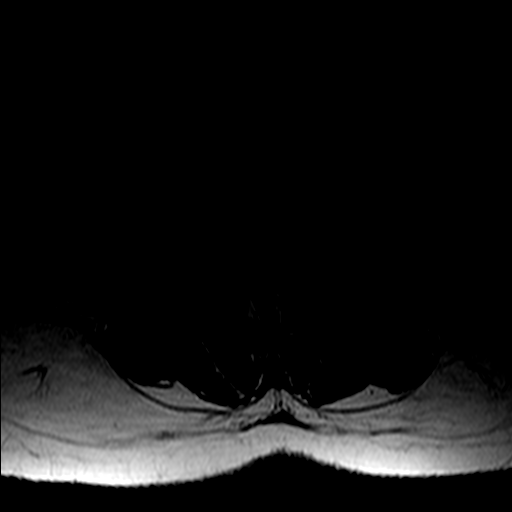
[im 31/37]
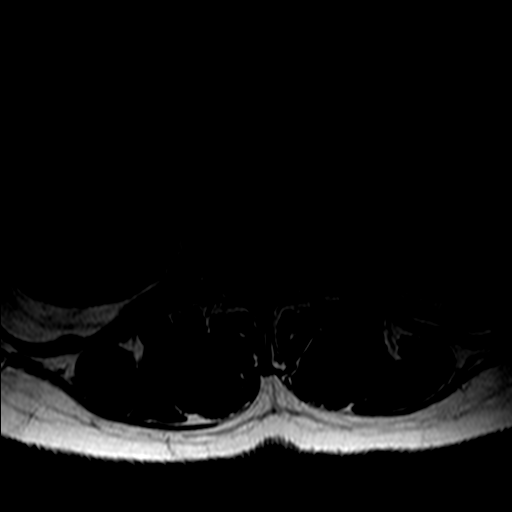

[15 of 48 positions shown; findings below may reference images not displayed]

FINDINGS: Comparison radiographs demonstrate normal lumbar segmentation.
Vertebral alignment is normal. Vertebral body heights and
intervertebral disc space heights are preserved. Mild disc
desiccation is present throughout the lumbar spine, greatest at
L5-S1. Vertebral bone marrow signal is unremarkable.

The conus medullaris is normal in signal and terminates at L1-2. The
paraspinal soft tissues are unremarkable. Mild-to-moderate bladder
distention is partially visualized.

T11-12: Only imaged sagittally. Minimal disc bulging without
stenosis.

T12-L1 and L1-2:  Negative.

L2-3: Minimal disc bulging and mild facet hypertrophy result in
minimal bilateral lateral recess narrowing without spinal canal or
neural foraminal stenosis.

L3-4: Minimal disc bulging and mild facet and ligamentum flavum
hypertrophy result in mild bilateral lateral recess stenosis without
spinal canal or neural foraminal stenosis.

L4-5: Minimal disc bulging and mild facet and ligamentum flavum
hypertrophy result in mild bilateral lateral recess stenosis without
spinal canal or neural foraminal stenosis.

L5-S1:  Mild facet hypertrophy without stenosis.
IMPRESSION: Minimal lumbar spondylosis and mild facet hypertrophy resulting in
mild bilateral lateral recess stenosis at L3-4 and L4-5.

## 2021-09-12 ENCOUNTER — Other Ambulatory Visit: Payer: Self-pay

## 2021-09-12 ENCOUNTER — Encounter: Payer: Self-pay | Admitting: Emergency Medicine

## 2021-09-12 ENCOUNTER — Emergency Department
Admission: EM | Admit: 2021-09-12 | Discharge: 2021-09-12 | Disposition: A | Discharge status: Home / Self Care | Payer: Medicare (Managed Care) | Attending: Emergency Medicine | Admitting: Emergency Medicine

## 2021-09-12 DIAGNOSIS — R059 Cough, unspecified: Secondary | ICD-10-CM | POA: Diagnosis not present

## 2021-09-12 DIAGNOSIS — R0981 Nasal congestion: Secondary | ICD-10-CM | POA: Diagnosis not present

## 2021-09-12 DIAGNOSIS — Z5321 Procedure and treatment not carried out due to patient leaving prior to being seen by health care provider: Secondary | ICD-10-CM | POA: Insufficient documentation

## 2021-09-12 DIAGNOSIS — R6883 Chills (without fever): Secondary | ICD-10-CM | POA: Insufficient documentation

## 2021-09-12 NOTE — ED Triage Notes (Signed)
C/O nasal congestion, chills, cough.  Aching all over.

## 2021-09-12 NOTE — ED Notes (Signed)
No answer when called for a room. 

## 2021-09-12 NOTE — ED Notes (Signed)
No answer when called several times from lobby 

## 2023-05-13 ENCOUNTER — Encounter: Payer: Self-pay | Admitting: Family

## 2023-05-13 ENCOUNTER — Ambulatory Visit: Payer: Medicare HMO | Admitting: Family

## 2023-05-13 VITALS — BP 132/74 | HR 65 | Ht 66.0 in | Wt 169.4 lb

## 2023-05-13 DIAGNOSIS — E782 Mixed hyperlipidemia: Secondary | ICD-10-CM

## 2023-05-13 DIAGNOSIS — M797 Fibromyalgia: Secondary | ICD-10-CM

## 2023-05-13 DIAGNOSIS — F4310 Post-traumatic stress disorder, unspecified: Secondary | ICD-10-CM | POA: Diagnosis not present

## 2023-05-13 DIAGNOSIS — I10 Essential (primary) hypertension: Secondary | ICD-10-CM

## 2023-05-13 DIAGNOSIS — R7303 Prediabetes: Secondary | ICD-10-CM | POA: Diagnosis not present

## 2023-05-13 DIAGNOSIS — J441 Chronic obstructive pulmonary disease with (acute) exacerbation: Secondary | ICD-10-CM

## 2023-05-13 DIAGNOSIS — E559 Vitamin D deficiency, unspecified: Secondary | ICD-10-CM

## 2023-05-13 DIAGNOSIS — F331 Major depressive disorder, recurrent, moderate: Secondary | ICD-10-CM | POA: Diagnosis not present

## 2023-05-13 DIAGNOSIS — J449 Chronic obstructive pulmonary disease, unspecified: Secondary | ICD-10-CM | POA: Diagnosis not present

## 2023-05-13 DIAGNOSIS — G9589 Other specified diseases of spinal cord: Secondary | ICD-10-CM | POA: Diagnosis not present

## 2023-05-13 MED ORDER — AMLODIPINE BESYLATE 5 MG PO TABS: 5.0000 mg | ORAL_TABLET | Freq: Every day | ORAL | 11 refills | Status: DC

## 2023-05-13 MED ORDER — PROPRANOLOL HCL 10 MG PO TABS: 10.0000 mg | ORAL_TABLET | Freq: Two times a day (BID) | ORAL | 0 refills | Status: DC

## 2023-05-13 MED ORDER — LISINOPRIL 20 MG PO TABS: 20.0000 mg | ORAL_TABLET | Freq: Every day | ORAL | 11 refills | Status: DC

## 2023-05-13 NOTE — Progress Notes (Signed)
New Patient Office Visit  Subjective    Patient ID: Diane Knapp, female    DOB: 25-Feb-1966  Age: 57 y.o. MRN: [id number]  CC:  Chief Complaint  Patient presents with   New Patient (Initial Visit)    HPI Diane Knapp presents to establish care  Previous Primary Care provider/office:  Carlena Hurl, [facility], [place].    she does have additional concerns to discuss today.   Just moved from [place] back to the area, separating from her husband.  Needs refills on meds  She also says that her previous PCP was getting ready to set up referral to a cardiologist because her most recent EKG had "something abnormal". She needs referral to a neurologist as well for her myelomalacia.   No other concerns today.     Outpatient Encounter Medications as of 05/13/2023  Medication Sig   amLODipine (NORVASC) 5 MG tablet Take 1 tablet (5 mg total) by mouth daily.   Cholecalciferol (VITAMIN D3) 2000 units capsule Take 1 capsule (2,000 Units total) by mouth daily.   lisinopril (ZESTRIL) 20 MG tablet Take 1 tablet (20 mg total) by mouth daily.   propranolol (INDERAL) 10 MG tablet Take 1 tablet (10 mg total) by mouth 2 (two) times daily.   [DISCONTINUED] ARIPiprazole (ABILIFY) 15 MG tablet Take 15 mg by mouth daily.    [DISCONTINUED] citalopram (CELEXA) 20 MG tablet Take by mouth.   [DISCONTINUED] oxyCODONE (OXY IR/ROXICODONE) 5 MG immediate release tablet Take 1 tablet (5 mg total) by mouth every 6 (six) hours as needed for moderate pain or severe pain.   [DISCONTINUED] pregabalin (LYRICA) 150 MG capsule Take 1 capsule (150 mg total) by mouth every 8 (eight) hours.   [DISCONTINUED] doxycycline (VIBRA-TABS) 100 MG tablet take 1 tablet by mouth once daily for 10 days (Patient not taking: Reported on 05/13/2023)   [DISCONTINUED] ofloxacin (OCUFLOX) 0.3 % ophthalmic solution instill 1 drop into right eye three times a day for 10 days (Patient not taking: Reported on 05/13/2023)   No  facility-administered encounter medications on file as of 05/13/2023.    Past Medical History:  Diagnosis Date   Anxiety    Callus of foot    right bottom of foot- removed 11/24/15   Chronic cervical radicular pain (Right) 10/12/2015   COPD (chronic obstructive pulmonary disease) (HCC)    Depression    Encounter for long-term (current) use of medications 08/31/2015   Encounter for therapeutic drug level monitoring 11/24/2015   Hypomagnesemia 10/27/2015   Marijuana use (see 11/24/2015 UDS) 08/31/2015   Myelomalacia (HCC)    Noncompliance 08/31/2015   Patient previously discharged from [facility] pain clinic secondary to missing appointments. 10/12/2015 - patient is taking more medication than prescribed. 12/22/2015 - opioid analgesic pain management treatment option terminated today due to noncompliance with medication policy and signed medication agreement. Patient tested positive for cannabinoids on the 11/24/2015 UDS. [facility]   Noncompliance with medication treatment due to overuse of medication 10/12/2015   Spondylosis    Substance use disorder Risk: HIGH 08/31/2015   Previously found to have a UDS positive for unreported use of ETOH and cannabinoids on 12/07/2013.       Past Surgical History:  Procedure Laterality Date   RIGHT OOPHORECTOMY     SPINE SURGERY      Family History  Problem Relation Age of Onset   Diabetes Mother    Cancer Mother    Stroke Mother    Hypertension Mother  Social History   Socioeconomic History   Marital status: Single    Spouse name: Not on file   Number of children: Not on file   Years of education: Not on file   Highest education level: Not on file  Occupational History   Not on file  Tobacco Use   Smoking status: Every Day    Current packs/day: 1.00    Types: Cigarettes   Smokeless tobacco: Not on file  Substance and Sexual Activity   Alcohol use: Yes    Alcohol/week: 0.0 standard drinks of alcohol    Comment:  now and then/occassional   Drug use: No   Sexual activity: Not on file  Other Topics Concern   Not on file  Social History Narrative   Not on file   Social Determinants of Health   Financial Resource Strain: Not on file  Food Insecurity: Not on file  Transportation Needs: Not on file  Physical Activity: Not on file  Stress: Not on file  Social Connections: Not on file  Intimate Partner Violence: Not on file    Review of Systems  Musculoskeletal:  Positive for myalgias.       Spasticity.   Neurological:  Positive for weakness.  All other systems reviewed and are negative.       Objective    BP 132/74   Pulse 65   Ht 5\' 6"  (1.676 m)   Wt 169 lb 6.4 oz (76.8 kg)   SpO2 98%   BMI 27.34 kg/m   Physical Exam Vitals and nursing note reviewed.  Constitutional:      Appearance: Normal appearance. She is normal weight.  HENT:     Head: Normocephalic.  Eyes:     Pupils: Pupils are equal, round, and reactive to light.  Cardiovascular:     Rate and Rhythm: Normal rate.  Pulmonary:     Effort: Pulmonary effort is normal.  Neurological:     Mental Status: She is alert.     Gait: Gait abnormal.     Comments: Muscle spasms         Assessment & Plan:   Problem List Items Addressed This Visit       Active Problems   Major depressive disorder, recurrent episode, moderate (HCC)    Referring pt to psychiatry.  Will refill meds for now.       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  Will defer to them for further changes.        Fibromyalgia (Chronic)   Chronic obstructive pulmonary disease (COPD) (HCC)    Patient stable.  Well controlled with current therapy.   Continue current meds.       Vitamin D deficiency    Checking labs today.  Will continue supplements as needed.        Essential hypertension, benign    Blood pressure well controlled with  current medications.  Continue current therapy.  Will reassess at follow up.       Relevant Medications   lisinopril (ZESTRIL) 20 MG tablet   amLODipine (NORVASC) 5 MG tablet   propranolol (INDERAL) 10 MG tablet   Other Visit Diagnoses     Mixed hyperlipidemia    -  Primary   Relevant Medications   lisinopril (ZESTRIL) 20 MG tablet   amLODipine (NORVASC) 5 MG tablet   propranolol (INDERAL) 10 MG tablet   Prediabetes  Return in about 2 weeks (around 05/27/2023) for F/U.   Total time spent: 45 minutes  Miki Kins, FNP  05/13/2023  This document may have been prepared by [facility] Voice Recognition software and as such may include unintentional dictation errors.

## 2023-05-27 ENCOUNTER — Encounter: Payer: Self-pay | Admitting: Family

## 2023-05-27 ENCOUNTER — Ambulatory Visit (INDEPENDENT_AMBULATORY_CARE_PROVIDER_SITE_OTHER): Payer: Medicare HMO | Admitting: Family

## 2023-05-27 VITALS — BP 140/79 | HR 69 | Ht 66.0 in | Wt 168.2 lb

## 2023-05-27 DIAGNOSIS — J439 Emphysema, unspecified: Secondary | ICD-10-CM

## 2023-05-27 DIAGNOSIS — R7303 Prediabetes: Secondary | ICD-10-CM | POA: Diagnosis not present

## 2023-05-27 DIAGNOSIS — E538 Deficiency of other specified B group vitamins: Secondary | ICD-10-CM

## 2023-05-27 DIAGNOSIS — G9589 Other specified diseases of spinal cord: Secondary | ICD-10-CM | POA: Diagnosis not present

## 2023-05-27 DIAGNOSIS — E039 Hypothyroidism, unspecified: Secondary | ICD-10-CM | POA: Diagnosis not present

## 2023-05-27 DIAGNOSIS — E559 Vitamin D deficiency, unspecified: Secondary | ICD-10-CM | POA: Diagnosis not present

## 2023-05-27 DIAGNOSIS — I10 Essential (primary) hypertension: Secondary | ICD-10-CM

## 2023-05-27 DIAGNOSIS — R079 Chest pain, unspecified: Secondary | ICD-10-CM

## 2023-05-27 DIAGNOSIS — E782 Mixed hyperlipidemia: Secondary | ICD-10-CM | POA: Diagnosis not present

## 2023-05-27 DIAGNOSIS — F331 Major depressive disorder, recurrent, moderate: Secondary | ICD-10-CM

## 2023-05-27 NOTE — Progress Notes (Signed)
Established Patient Office Visit  Subjective:  Patient ID: Diane Knapp, female    DOB: September 20, 1966  Age: 57 y.o. MRN: [id number]  Chief Complaint  Patient presents with   Follow-up    2 week f/u    Pt. Here today for 2 week n/p follow up.   Was supposed to have labs done at that appointment, but did not stop by the lab to do so.  She needs to be set up with a pain clinic for her chronic pian issues with her back and her neck.  Also needs to be set up with Neurology for her Neurological conditions.  Finally, she has been having some chest pains that are concerning her.  She says they occur on occasion, last for anywhere from 5-30 minutes.   No other concerns today.      No other concerns at this time.   Past Medical History:  Diagnosis Date   Anxiety    Callus of foot    right bottom of foot- removed 11/24/15   Chronic cervical radicular pain (Right) 10/12/2015   COPD (chronic obstructive pulmonary disease) (HCC)    Depression    Encounter for long-term (current) use of medications 08/31/2015   Encounter for therapeutic drug level monitoring 11/24/2015   Hypomagnesemia 10/27/2015   Marijuana use (see 11/24/2015 UDS) 08/31/2015   Myelomalacia (HCC)    Noncompliance 08/31/2015   Patient previously discharged from [facility] pain clinic secondary to missing appointments. 10/12/2015 - patient is taking more medication than prescribed. 12/22/2015 - opioid analgesic pain management treatment option terminated today due to noncompliance with medication policy and signed medication agreement. Patient tested positive for cannabinoids on the 11/24/2015 UDS. [facility]   Noncompliance with medication treatment due to overuse of medication 10/12/2015   Spondylosis    Substance use disorder Risk: HIGH 08/31/2015   Previously found to have a UDS positive for unreported use of ETOH and cannabinoids on 12/07/2013.       Past Surgical History:  Procedure Laterality Date    RIGHT OOPHORECTOMY     SPINE SURGERY      Social History   Socioeconomic History   Marital status: Single    Spouse name: Not on file   Number of children: Not on file   Years of education: Not on file   Highest education level: Not on file  Occupational History   Not on file  Tobacco Use   Smoking status: Every Day    Current packs/day: 1.00    Types: Cigarettes   Smokeless tobacco: Not on file  Substance and Sexual Activity   Alcohol use: Yes    Alcohol/week: 0.0 standard drinks of alcohol    Comment: now and then/occassional   Drug use: No   Sexual activity: Not on file  Other Topics Concern   Not on file  Social History Narrative   Not on file   Social Determinants of Health   Financial Resource Strain: Not on file  Food Insecurity: Not on file  Transportation Needs: Not on file  Physical Activity: Not on file  Stress: Not on file  Social Connections: Not on file  Intimate Partner Violence: Not on file    Family History  Problem Relation Age of Onset   Diabetes Mother    Cancer Mother    Stroke Mother    Hypertension Mother     Allergies  Allergen Reactions   Duloxetine Other (See Comments)   Cymbalta [Duloxetine Hcl] Other (See  Comments)    hallucinations    Review of Systems  Cardiovascular:  Positive for chest pain and palpitations.  Musculoskeletal:  Positive for back pain, joint pain and myalgias.  Neurological:  Positive for weakness.  All other systems reviewed and are negative.      Objective:   BP (!) 140/79   Pulse 69   Ht 5\' 6"  (1.676 m)   Wt 168 lb 3.2 oz (76.3 kg)   SpO2 97%   BMI 27.15 kg/m   Vitals:   05/27/23 1347  BP: (!) 140/79  Pulse: 69  Height: 5\' 6"  (1.676 m)  Weight: 168 lb 3.2 oz (76.3 kg)  SpO2: 97%  BMI (Calculated): 27.16    Physical Exam Vitals and nursing note reviewed.  Constitutional:      Appearance: Normal appearance. She is normal weight.  HENT:     Head: Normocephalic.  Eyes:      Pupils: Pupils are equal, round, and reactive to light.  Cardiovascular:     Rate and Rhythm: Normal rate.  Pulmonary:     Effort: Pulmonary effort is normal.  Musculoskeletal:        General: Normal range of motion.  Neurological:     General: No focal deficit present.     Mental Status: She is alert and oriented to person, place, and time. Mental status is at baseline.     Coordination: Coordination abnormal.     Gait: Gait abnormal.     Deep Tendon Reflexes: Reflexes abnormal.  Psychiatric:        Mood and Affect: Mood normal.        Behavior: Behavior normal.        Thought Content: Thought content normal.        Judgment: Judgment normal.      Results for orders placed or performed in visit on 05/27/23  Lipid panel  Result Value Ref Range   Cholesterol, Total 164 100 - 199 mg/dL   Triglycerides 147 0 - 149 mg/dL   HDL 51 >82 mg/dL   VLDL Cholesterol Cal 20 5 - 40 mg/dL   LDL Chol Calc (NIH) 93 0 - 99 mg/dL   Chol/HDL Ratio 3.2 0.0 - 4.4 ratio  VITAMIN D 25 Hydroxy (Vit-D Deficiency, Fractures)  Result Value Ref Range   Vit D, 25-Hydroxy 26.3 (L) 30.0 - 100.0 ng/mL  CMP14+EGFR  Result Value Ref Range   Glucose 89 70 - 99 mg/dL   BUN 7 6 - 24 mg/dL   Creatinine, Ser 9.56 0.57 - 1.00 mg/dL   eGFR 71 >21 HY/QMV/7.84   BUN/Creatinine Ratio 7 (L) 9 - 23   Sodium 142 134 - 144 mmol/L   Potassium 3.6 3.5 - 5.2 mmol/L   Chloride 105 96 - 106 mmol/L   CO2 22 20 - 29 mmol/L   Calcium 9.5 8.7 - 10.2 mg/dL   Total Protein 6.8 6.0 - 8.5 g/dL   Albumin 4.3 3.8 - 4.9 g/dL   Globulin, Total 2.5 1.5 - 4.5 g/dL   Bilirubin Total 0.3 0.0 - 1.2 mg/dL   Alkaline Phosphatase 82 44 - 121 IU/L   AST 15 0 - 40 IU/L   ALT 7 0 - 32 IU/L  TSH  Result Value Ref Range   TSH 1.150 0.450 - 4.500 uIU/mL  Hemoglobin A1c  Result Value Ref Range   Hgb A1c MFr Bld 5.7 (H) 4.8 - 5.6 %   Est. average glucose Bld gHb Est-mCnc 117 mg/dL  Vitamin O96  Result Value Ref Range   Vitamin B-12 551  232 - 1,245 pg/mL    Recent Results (from the past 2160 hour(s))  Lipid panel     Status: None   Collection Time: 05/27/23  2:31 PM  Result Value Ref Range   Cholesterol, Total 164 100 - 199 mg/dL   Triglycerides 086 0 - 149 mg/dL   HDL 51 >57 mg/dL   VLDL Cholesterol Cal 20 5 - 40 mg/dL   LDL Chol Calc (NIH) 93 0 - 99 mg/dL   Chol/HDL Ratio 3.2 0.0 - 4.4 ratio    Comment:                                   T. Chol/HDL Ratio                                             Men  Women                               1/2 Avg.Risk  3.4    3.3                                   Avg.Risk  5.0    4.4                                2X Avg.Risk  9.6    7.1                                3X Avg.Risk 23.4   11.0   VITAMIN D 25 Hydroxy (Vit-D Deficiency, Fractures)     Status: Abnormal   Collection Time: 05/27/23  2:31 PM  Result Value Ref Range   Vit D, 25-Hydroxy 26.3 (L) 30.0 - 100.0 ng/mL    Comment: Vitamin D deficiency has been defined by the Institute of Medicine and an Endocrine Society practice guideline as a level of serum 25-OH vitamin D less than 20 ng/mL (1,2). The Endocrine Society went on to further define vitamin D insufficiency as a level between 21 and 29 ng/mL (2). 1. IOM (Institute of Medicine). 2010. Dietary reference    intakes for calcium and D. [place]: The    [employer]. 2. Holick MF, Binkley , Bischoff-Ferrari HA, et al.    Evaluation, treatment, and prevention of vitamin D    deficiency: an Endocrine Society clinical practice    guideline. JCEM. 2011 Jul; 96(7):1911-30.   CMP14+EGFR     Status: Abnormal   Collection Time: 05/27/23  2:31 PM  Result Value Ref Range   Glucose 89 70 - 99 mg/dL   BUN 7 6 - 24 mg/dL   Creatinine, Ser 8.46 0.57 - 1.00 mg/dL   eGFR 71 >96 EX/BMW/4.13   BUN/Creatinine Ratio 7 (L) 9 - 23   Sodium 142 134 - 144 mmol/L   Potassium 3.6 3.5 - 5.2 mmol/L   Chloride 105 96 - 106 mmol/L   CO2 22 20 - 29 mmol/L   Calcium  9.5 8.7 - 10.2 mg/dL  Total Protein 6.8 6.0 - 8.5 g/dL   Albumin 4.3 3.8 - 4.9 g/dL   Globulin, Total 2.5 1.5 - 4.5 g/dL   Bilirubin Total 0.3 0.0 - 1.2 mg/dL   Alkaline Phosphatase 82 44 - 121 IU/L   AST 15 0 - 40 IU/L   ALT 7 0 - 32 IU/L  TSH     Status: None   Collection Time: 05/27/23  2:31 PM  Result Value Ref Range   TSH 1.150 0.450 - 4.500 uIU/mL  Hemoglobin A1c     Status: Abnormal   Collection Time: 05/27/23  2:31 PM  Result Value Ref Range   Hgb A1c MFr Bld 5.7 (H) 4.8 - 5.6 %    Comment:          Prediabetes: 5.7 - 6.4          Diabetes: >6.4          Glycemic control for adults with diabetes: <7.0    Est. average glucose Bld gHb Est-mCnc 117 mg/dL  Vitamin M57     Status: None   Collection Time: 05/27/23  2:31 PM  Result Value Ref Range   Vitamin B-12 551 232 - 1,245 pg/mL       Assessment & Plan:   Problem List Items Addressed This Visit       Active Problems   Major depressive disorder, recurrent episode, moderate (HCC)    Patient stable.  Well controlled with current therapy.   Continue current meds.       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  Will also set up for pain clinic, given her pain.   Will defer to them for further changes.      Chronic obstructive pulmonary disease (COPD) (HCC)    Patient stable.  Well controlled with current therapy.   Continue current meds.       Vitamin D deficiency - Primary    Checking labs today.  Will continue supplements as needed.       Relevant Orders   CMP14+EGFR (Completed)   Essential hypertension, benign    Blood pressure well controlled with current medications.  Continue current therapy.  Will reassess at follow up.       Relevant Medications   prazosin (MINIPRESS) 1 MG capsule   Other Relevant Orders   CMP14+EGFR (Completed)    Other Visit Diagnoses     Hypothyroidism (acquired)       Relevant Orders   CMP14+EGFR (Completed)   TSH (Completed)   Prediabetes       Relevant Orders   CMP14+EGFR (Completed)   Hemoglobin A1c (Completed)   Mixed hyperlipidemia       Relevant Medications   prazosin (MINIPRESS) 1 MG capsule   Other Relevant Orders   Lipid panel (Completed)   CMP14+EGFR (Completed)   Vitamin D deficiency, unspecified       Relevant Orders   VITAMIN D 25 Hydroxy (Vit-D Deficiency, Fractures) (Completed)   CMP14+EGFR (Completed)   B12 deficiency due to diet       Relevant Orders   CMP14+EGFR (Completed)   Vitamin B12 (Completed)   Chest pain, unspecified type       Relevant Orders   EKG 12-Lead       No follow-ups on file.   Total time spent: 20 minutes  Hadiya Spoerl  Daine Gravel, FNP  05/27/2023   This document may have been prepared by [facility] Voice Recognition software and as such may include unintentional dictation errors.

## 2023-05-28 ENCOUNTER — Encounter: Payer: Self-pay | Admitting: Cardiovascular Disease

## 2023-05-28 ENCOUNTER — Encounter: Payer: Self-pay | Admitting: Family

## 2023-05-28 ENCOUNTER — Ambulatory Visit (INDEPENDENT_AMBULATORY_CARE_PROVIDER_SITE_OTHER): Payer: Medicare HMO | Admitting: Cardiovascular Disease

## 2023-05-28 VITALS — BP 135/85 | HR 69 | Ht 66.0 in | Wt 168.2 lb

## 2023-05-28 DIAGNOSIS — F17210 Nicotine dependence, cigarettes, uncomplicated: Secondary | ICD-10-CM | POA: Diagnosis not present

## 2023-05-28 DIAGNOSIS — R0789 Other chest pain: Secondary | ICD-10-CM | POA: Diagnosis not present

## 2023-05-28 DIAGNOSIS — R0602 Shortness of breath: Secondary | ICD-10-CM | POA: Diagnosis not present

## 2023-05-28 DIAGNOSIS — R42 Dizziness and giddiness: Secondary | ICD-10-CM

## 2023-05-28 DIAGNOSIS — E782 Mixed hyperlipidemia: Secondary | ICD-10-CM

## 2023-05-28 DIAGNOSIS — I10 Essential (primary) hypertension: Secondary | ICD-10-CM

## 2023-05-28 DIAGNOSIS — J439 Emphysema, unspecified: Secondary | ICD-10-CM | POA: Diagnosis not present

## 2023-05-28 MED ORDER — ASPIRIN 81 MG PO TBEC
81.0000 mg | DELAYED_RELEASE_TABLET | Freq: Every day | ORAL | 12 refills | Status: AC
Start: 2023-05-28 — End: ?

## 2023-05-28 MED ORDER — ISOSORBIDE MONONITRATE ER 30 MG PO TB24
30.0000 mg | ORAL_TABLET | Freq: Every day | ORAL | 1 refills | Status: DC
Start: 2023-05-28 — End: 2023-09-07

## 2023-05-28 NOTE — Progress Notes (Signed)
Cardiology Office Note   Date:  05/28/2023   ID:  JOUA VANDERKOLK, DOB Sep 08, 1966, MRN [id number]  PCP:  Miki Kins, FNP  Cardiologist:  Adrian Blackwater, MD      History of Present Illness: Diane Knapp is a 57 y.o. female who presents for  Chief Complaint  Patient presents with   Acute Visit    Chest pains    Chest Pain  This is a new problem. The current episode started more than 1 month ago. The onset quality is gradual. The problem has been gradually worsening. The pain is present in the substernal region. The pain is at a severity of 4/10. The pain is moderate. The quality of the pain is described as heavy. Associated symptoms include dizziness, orthopnea, palpitations, PND and syncope.      Past Medical History:  Diagnosis Date   Anxiety    Callus of foot    right bottom of foot- removed 11/24/15   COPD (chronic obstructive pulmonary disease) (HCC)    Depression    Myelomalacia (HCC)    Spondylosis      Past Surgical History:  Procedure Laterality Date   RIGHT OOPHORECTOMY     SPINE SURGERY       Current Outpatient Medications  Medication Sig Dispense Refill   aspirin EC 81 MG tablet Take 1 tablet (81 mg total) by mouth daily. Swallow whole. 30 tablet 12   isosorbide mononitrate (IMDUR) 30 MG 24 hr tablet Take 1 tablet (30 mg total) by mouth daily. 30 tablet 1   amLODipine (NORVASC) 5 MG tablet Take 1 tablet (5 mg total) by mouth daily. 30 tablet 11   ARIPiprazole (ABILIFY) 15 MG tablet Take 15 mg by mouth daily.      buPROPion ER (WELLBUTRIN SR) 100 MG 12 hr tablet Take 100 mg by mouth 2 (two) times daily.     Cholecalciferol (VITAMIN D3) 2000 units capsule Take 1 capsule (2,000 Units total) by mouth daily. 30 capsule PRN   citalopram (CELEXA) 20 MG tablet Take by mouth.     FLUoxetine (PROZAC) 40 MG capsule Take 40 mg by mouth daily.     lisinopril (ZESTRIL) 20 MG tablet Take 1 tablet (20 mg total) by mouth daily. 30 tablet 11   OLANZapine  (ZYPREXA) 5 MG tablet Take 5 mg by mouth at bedtime.     oxyCODONE (OXY IR/ROXICODONE) 5 MG immediate release tablet Take 1 tablet (5 mg total) by mouth every 6 (six) hours as needed for moderate pain or severe pain. 120 tablet 0   prazosin (MINIPRESS) 1 MG capsule Take 1 mg by mouth at bedtime.     pregabalin (LYRICA) 150 MG capsule Take 1 capsule (150 mg total) by mouth every 8 (eight) hours. 90 capsule 5   propranolol (INDERAL) 10 MG tablet Take 1 tablet (10 mg total) by mouth 2 (two) times daily. 60 tablet 0   No current facility-administered medications for this visit.    Allergies:   Duloxetine and Cymbalta [duloxetine hcl]    Social History:   reports that she has been smoking cigarettes. She does not have any smokeless tobacco history on file. She reports current alcohol use. She reports that she does not use drugs.   Family History:  family history includes Cancer in her mother; Diabetes in her mother; Hypertension in her mother; Stroke in her mother.    ROS:     Review of Systems  Constitutional: Negative.   HENT: Negative.  Eyes: Negative.   Respiratory: Negative.    Cardiovascular:  Positive for chest pain, palpitations, orthopnea, syncope and PND.  Gastrointestinal: Negative.   Genitourinary: Negative.   Musculoskeletal: Negative.   Skin: Negative.   Neurological:  Positive for dizziness.  Endo/Heme/Allergies: Negative.   Psychiatric/Behavioral: Negative.    All other systems reviewed and are negative.     All other systems are reviewed and negative.    PHYSICAL EXAM: VS:  BP 135/85   Pulse 69   Ht 5\' 6"  (1.676 m)   Wt 168 lb 3.2 oz (76.3 kg)   SpO2 99%   BMI 27.15 kg/m  , BMI Body mass index is 27.15 kg/m. Last weight:  Wt Readings from Last 3 Encounters:  05/28/23 168 lb 3.2 oz (76.3 kg)  05/27/23 168 lb 3.2 oz (76.3 kg)  05/13/23 169 lb 6.4 oz (76.8 kg)     Physical Exam Constitutional:      Appearance: Normal appearance.  Cardiovascular:      Rate and Rhythm: Normal rate and regular rhythm.     Heart sounds: Normal heart sounds.  Pulmonary:     Effort: Pulmonary effort is normal.     Breath sounds: Normal breath sounds.  Musculoskeletal:     Right lower leg: No edema.     Left lower leg: No edema.  Neurological:     Mental Status: She is alert.       EKG: NSR 70/min non specific st and t changes  Recent Labs: 05/27/2023: ALT 7; BUN 7; Creatinine, Ser 0.94; Potassium 3.6; Sodium 142; TSH 1.150    Lipid Panel    Component Value Date/Time   CHOL 164 05/27/2023 1431   TRIG 108 05/27/2023 1431   HDL 51 05/27/2023 1431   CHOLHDL 3.2 05/27/2023 1431   LDLCALC 93 05/27/2023 1431      Other studies Reviewed: Additional studies/ records that were reviewed today include:  Review of the above records demonstrates:       No data to display            ASSESSMENT AND PLAN:    ICD-10-CM   1. Pulmonary emphysema, unspecified emphysema type (HCC)  J43.9 MYOCARDIAL PERFUSION IMAGING    PCV ECHOCARDIOGRAM COMPLETE    aspirin EC 81 MG tablet    isosorbide mononitrate (IMDUR) 30 MG 24 hr tablet    2. Essential hypertension, benign  I10 MYOCARDIAL PERFUSION IMAGING    PCV ECHOCARDIOGRAM COMPLETE    aspirin EC 81 MG tablet    isosorbide mononitrate (IMDUR) 30 MG 24 hr tablet    3. Mixed hyperlipidemia  E78.2 MYOCARDIAL PERFUSION IMAGING    PCV ECHOCARDIOGRAM COMPLETE    aspirin EC 81 MG tablet    isosorbide mononitrate (IMDUR) 30 MG 24 hr tablet    4. Other chest pain  R07.89 MYOCARDIAL PERFUSION IMAGING    PCV ECHOCARDIOGRAM COMPLETE    aspirin EC 81 MG tablet    isosorbide mononitrate (IMDUR) 30 MG 24 hr tablet   Advise echo, stress test as has recurrent c hest pain and SOB.Add asp 81, isoserbide in mean time    5. SOB (shortness of breath)  R06.02 MYOCARDIAL PERFUSION IMAGING    PCV ECHOCARDIOGRAM COMPLETE    aspirin EC 81 MG tablet    isosorbide mononitrate (IMDUR) 30 MG 24 hr tablet   Has non specific  st and t changes    6. Dizziness  R42 MYOCARDIAL PERFUSION IMAGING    PCV ECHOCARDIOGRAM COMPLETE  aspirin EC 81 MG tablet    isosorbide mononitrate (IMDUR) 30 MG 24 hr tablet       Problem List Items Addressed This Visit       Respiratory   Chronic obstructive pulmonary disease (COPD) (HCC) - Primary   Relevant Medications   aspirin EC 81 MG tablet   isosorbide mononitrate (IMDUR) 30 MG 24 hr tablet   Other Relevant Orders   MYOCARDIAL PERFUSION IMAGING   PCV ECHOCARDIOGRAM COMPLETE   Other Visit Diagnoses     Essential hypertension, benign       Relevant Medications   aspirin EC 81 MG tablet   isosorbide mononitrate (IMDUR) 30 MG 24 hr tablet   Other Relevant Orders   MYOCARDIAL PERFUSION IMAGING   PCV ECHOCARDIOGRAM COMPLETE   Mixed hyperlipidemia       Relevant Medications   aspirin EC 81 MG tablet   isosorbide mononitrate (IMDUR) 30 MG 24 hr tablet   Other Relevant Orders   MYOCARDIAL PERFUSION IMAGING   PCV ECHOCARDIOGRAM COMPLETE   Other chest pain       Advise echo, stress test as has recurrent c hest pain and SOB.Add asp 81, isoserbide in mean time   Relevant Medications   aspirin EC 81 MG tablet   isosorbide mononitrate (IMDUR) 30 MG 24 hr tablet   Other Relevant Orders   MYOCARDIAL PERFUSION IMAGING   PCV ECHOCARDIOGRAM COMPLETE   SOB (shortness of breath)       Has non specific st and t changes   Relevant Medications   aspirin EC 81 MG tablet   isosorbide mononitrate (IMDUR) 30 MG 24 hr tablet   Other Relevant Orders   MYOCARDIAL PERFUSION IMAGING   PCV ECHOCARDIOGRAM COMPLETE   Dizziness       Relevant Medications   aspirin EC 81 MG tablet   isosorbide mononitrate (IMDUR) 30 MG 24 hr tablet   Other Relevant Orders   MYOCARDIAL PERFUSION IMAGING   PCV ECHOCARDIOGRAM COMPLETE          Disposition:   Return in about 4 weeks (around 06/25/2023) for echo, stress test and f/u.    Total time spent: 50 minutes  Signed,  Adrian Blackwater, MD   05/28/2023 11:51 AM    [facility]

## 2023-05-30 ENCOUNTER — Encounter: Payer: Self-pay | Admitting: Cardiovascular Disease

## 2023-06-01 ENCOUNTER — Encounter: Payer: Self-pay | Admitting: Family

## 2023-06-01 DIAGNOSIS — I10 Essential (primary) hypertension: Secondary | ICD-10-CM | POA: Insufficient documentation

## 2023-06-01 NOTE — Assessment & Plan Note (Signed)
Referring pt to Neurology so they can help [place] manage this.  Will defer to them for further changes.

## 2023-06-01 NOTE — Assessment & Plan Note (Signed)
Blood pressure well controlled with current medications.  Continue current therapy.  Will reassess at follow up.  

## 2023-06-01 NOTE — Assessment & Plan Note (Signed)
Checking labs today.  Will continue supplements as needed.  

## 2023-06-01 NOTE — Assessment & Plan Note (Signed)
Referring pt to psychiatry.  Will refill meds for now.

## 2023-06-01 NOTE — Assessment & Plan Note (Signed)
Patient stable.  Well controlled with current therapy.   Continue current meds.  

## 2023-06-07 ENCOUNTER — Ambulatory Visit: Payer: Medicare HMO

## 2023-06-07 DIAGNOSIS — R0602 Shortness of breath: Secondary | ICD-10-CM

## 2023-06-07 DIAGNOSIS — I361 Nonrheumatic tricuspid (valve) insufficiency: Secondary | ICD-10-CM

## 2023-06-07 DIAGNOSIS — I10 Essential (primary) hypertension: Secondary | ICD-10-CM

## 2023-06-07 DIAGNOSIS — J439 Emphysema, unspecified: Secondary | ICD-10-CM

## 2023-06-07 DIAGNOSIS — R42 Dizziness and giddiness: Secondary | ICD-10-CM

## 2023-06-07 DIAGNOSIS — I351 Nonrheumatic aortic (valve) insufficiency: Secondary | ICD-10-CM | POA: Diagnosis not present

## 2023-06-07 DIAGNOSIS — R0789 Other chest pain: Secondary | ICD-10-CM

## 2023-06-07 DIAGNOSIS — E782 Mixed hyperlipidemia: Secondary | ICD-10-CM

## 2023-06-11 ENCOUNTER — Ambulatory Visit (INDEPENDENT_AMBULATORY_CARE_PROVIDER_SITE_OTHER): Payer: Medicare HMO | Admitting: Cardiovascular Disease

## 2023-06-11 ENCOUNTER — Encounter: Payer: Self-pay | Admitting: Cardiovascular Disease

## 2023-06-11 VITALS — BP 140/90 | Ht 66.0 in | Wt 163.8 lb

## 2023-06-11 DIAGNOSIS — J439 Emphysema, unspecified: Secondary | ICD-10-CM | POA: Diagnosis not present

## 2023-06-11 DIAGNOSIS — R0789 Other chest pain: Secondary | ICD-10-CM

## 2023-06-11 DIAGNOSIS — I259 Chronic ischemic heart disease, unspecified: Secondary | ICD-10-CM

## 2023-06-11 DIAGNOSIS — E782 Mixed hyperlipidemia: Secondary | ICD-10-CM | POA: Diagnosis not present

## 2023-06-11 DIAGNOSIS — R0602 Shortness of breath: Secondary | ICD-10-CM

## 2023-06-11 DIAGNOSIS — I10 Essential (primary) hypertension: Secondary | ICD-10-CM | POA: Diagnosis not present

## 2023-06-11 NOTE — Patient Instructions (Signed)
Take proprnolol 1 tab night beffore and 2 tab 90 minutes prior to CTA coronaries

## 2023-06-11 NOTE — Progress Notes (Signed)
Cardiology Office Note   Date:  06/11/2023   ID:  [name], [name] [birth year], MRN [id number]  PCP:  Miki Kins, FNP  Cardiologist:  Adrian Blackwater, MD      History of Present Illness: Diane Knapp is a 57 y.o. female who presents for  Chief Complaint  Patient presents with   Follow-up    NST & ECHO    Crying with chest pain, and occurred all night.  Chest Pain  This is a new problem. The current episode started 1 to 4 weeks ago. The problem occurs 2 to 4 times per day. The problem has been waxing and waning. The pain is at a severity of 8/10. The pain is moderate. The quality of the pain is described as heavy. The pain radiates to the left arm.      Past Medical History:  Diagnosis Date   Anxiety    Callus of foot    right bottom of foot- removed 11/24/15   Chronic cervical radicular pain (Right) 10/12/2015   COPD (chronic obstructive pulmonary disease) (HCC)    Depression    Encounter for long-term (current) use of medications 08/31/2015   Encounter for therapeutic drug level monitoring 11/24/2015   Hypomagnesemia 10/27/2015   Marijuana use (see 11/24/2015 UDS) 08/31/2015   Myelomalacia (HCC)    Noncompliance 08/31/2015   Patient previously discharged from [facility] pain clinic secondary to missing appointments. 10/12/2015 - patient is taking more medication than prescribed. 12/22/2015 - opioid analgesic pain management treatment option terminated today due to noncompliance with medication policy and signed medication agreement. Patient tested positive for cannabinoids on the 11/24/2015 UDS. [facility]   Noncompliance with medication treatment due to overuse of medication 10/12/2015   Spondylosis    Substance use disorder Risk: HIGH 08/31/2015   Previously found to have a UDS positive for unreported use of ETOH and cannabinoids on 12/07/2013.        Past Surgical History:  Procedure Laterality Date   RIGHT OOPHORECTOMY     SPINE SURGERY        Current Outpatient Medications  Medication Sig Dispense Refill   amLODipine (NORVASC) 5 MG tablet Take 1 tablet (5 mg total) by mouth daily. 30 tablet 11   aspirin EC 81 MG tablet Take 1 tablet (81 mg total) by mouth daily. Swallow whole. 30 tablet 12   buPROPion ER (WELLBUTRIN SR) 100 MG 12 hr tablet Take 100 mg by mouth 2 (two) times daily.     Cholecalciferol (VITAMIN D3) 2000 units capsule Take 1 capsule (2,000 Units total) by mouth daily. 30 capsule PRN   FLUoxetine (PROZAC) 40 MG capsule Take 40 mg by mouth daily.     isosorbide mononitrate (IMDUR) 30 MG 24 hr tablet Take 1 tablet (30 mg total) by mouth daily. 30 tablet 1   lisinopril (ZESTRIL) 20 MG tablet Take 1 tablet (20 mg total) by mouth daily. 30 tablet 11   OLANZapine (ZYPREXA) 5 MG tablet Take 5 mg by mouth at bedtime.     prazosin (MINIPRESS) 1 MG capsule Take 1 mg by mouth at bedtime.     propranolol (INDERAL) 10 MG tablet Take 1 tablet (10 mg total) by mouth 2 (two) times daily. 60 tablet 0   No current facility-administered medications for this visit.    Allergies:   Duloxetine and Cymbalta [duloxetine hcl]    Social History:   reports that she has been smoking cigarettes. She does not have any  smokeless tobacco history on file. She reports current alcohol use. She reports that she does not use drugs.   Family History:  family history includes Cancer in her mother; Diabetes in her mother; Hypertension in her mother; Stroke in her mother.    ROS:     Review of Systems  Constitutional: Negative.   HENT: Negative.    Eyes: Negative.   Respiratory: Negative.    Cardiovascular:  Positive for chest pain.  Gastrointestinal: Negative.   Genitourinary: Negative.   Musculoskeletal: Negative.   Skin: Negative.   Neurological: Negative.   Endo/Heme/Allergies: Negative.   Psychiatric/Behavioral: Negative.    All other systems reviewed and are negative.     All other systems are reviewed and negative.     PHYSICAL EXAM: VS:  BP (!) 140/90   Ht 5\' 6"  (1.676 m)   Wt 163 lb 12.8 oz (74.3 kg)   BMI 26.44 kg/m  , BMI Body mass index is 26.44 kg/m. Last weight:  Wt Readings from Last 3 Encounters:  06/11/23 163 lb 12.8 oz (74.3 kg)  05/28/23 168 lb 3.2 oz (76.3 kg)  05/27/23 168 lb 3.2 oz (76.3 kg)     Physical Exam Constitutional:      Appearance: Normal appearance.  Cardiovascular:     Rate and Rhythm: Normal rate and regular rhythm.     Heart sounds: Normal heart sounds.  Pulmonary:     Effort: Pulmonary effort is normal.     Breath sounds: Normal breath sounds.  Musculoskeletal:     Right lower leg: No edema.     Left lower leg: No edema.  Neurological:     Mental Status: She is alert.       EKG:   Recent Labs: 05/27/2023: ALT 7; BUN 7; Creatinine, Ser 0.94; Potassium 3.6; Sodium 142; TSH 1.150    Lipid Panel    Component Value Date/Time   CHOL 164 05/27/2023 1431   TRIG 108 05/27/2023 1431   HDL 51 05/27/2023 1431   CHOLHDL 3.2 05/27/2023 1431   LDLCALC 93 05/27/2023 1431      Other studies Reviewed: Additional studies/ records that were reviewed today include:  Review of the above records demonstrates:       No data to display            ASSESSMENT AND PLAN:    ICD-10-CM   1. Other chest pain  R07.89 CT CORONARY MORPH W/CTA COR W/SCORE W/CA W/CM &/OR WO/CM   Crying with chest pain, stress test not approved with medicaid, will try ccta    2. Essential hypertension, benign  I10 CT CORONARY MORPH W/CTA COR W/SCORE W/CA W/CM &/OR WO/CM    3. Pulmonary emphysema, unspecified emphysema type (HCC)  J43.9 CT CORONARY MORPH W/CTA COR W/SCORE W/CA W/CM &/OR WO/CM    4. SOB (shortness of breath)  R06.02 CT CORONARY MORPH W/CTA COR W/SCORE W/CA W/CM &/OR WO/CM    5. Mixed hyperlipidemia  E78.2 CT CORONARY MORPH W/CTA COR W/SCORE W/CA W/CM &/OR WO/CM    6. Chest pain due to myocardial ischemia, unspecified ischemic chest pain type  I25.9 CT CORONARY  MORPH W/CTA COR W/SCORE W/CA W/CM &/OR WO/CM   do ccta, creat normal       Problem List Items Addressed This Visit       Cardiovascular and Mediastinum   Essential hypertension, benign   Relevant Orders   CT CORONARY MORPH W/CTA COR W/SCORE W/CA W/CM &/OR WO/CM     Respiratory  Chronic obstructive pulmonary disease (COPD) (HCC)   Relevant Orders   CT CORONARY MORPH W/CTA COR W/SCORE W/CA W/CM &/OR WO/CM   Other Visit Diagnoses     Other chest pain    -  Primary   Crying with chest pain, stress test not approved with medicaid, will try ccta   Relevant Orders   CT CORONARY MORPH W/CTA COR W/SCORE W/CA W/CM &/OR WO/CM   SOB (shortness of breath)       Relevant Orders   CT CORONARY MORPH W/CTA COR W/SCORE W/CA W/CM &/OR WO/CM   Mixed hyperlipidemia       Relevant Orders   CT CORONARY MORPH W/CTA COR W/SCORE W/CA W/CM &/OR WO/CM   Chest pain due to myocardial ischemia, unspecified ischemic chest pain type       do ccta, creat normal   Relevant Orders   CT CORONARY MORPH W/CTA COR W/SCORE W/CA W/CM &/OR WO/CM          Disposition:   Return in about 2 weeks (around 06/25/2023) for ccta and f/u.    Total time spent: 40 minutes  Signed,  Adrian Blackwater, MD  06/11/2023 9:38 AM    [facility]

## 2023-06-21 ENCOUNTER — Encounter: Payer: Self-pay | Admitting: Family

## 2023-06-21 NOTE — Assessment & Plan Note (Signed)
Blood pressure well controlled with current medications.  Continue current therapy.  Will reassess at follow up.  

## 2023-06-21 NOTE — Assessment & Plan Note (Signed)
Checking labs today.  Will continue supplements as needed.  

## 2023-06-21 NOTE — Assessment & Plan Note (Signed)
Patient stable.  Well controlled with current therapy.   Continue current meds.  

## 2023-06-21 NOTE — Assessment & Plan Note (Signed)
Sending referral to Neurology.  Will also set up for pain clinic, given her pain.   Will defer to them for further changes.

## 2023-06-25 ENCOUNTER — Other Ambulatory Visit: Payer: Medicare HMO

## 2023-06-28 ENCOUNTER — Ambulatory Visit: Payer: Medicare HMO | Admitting: Family

## 2023-07-02 ENCOUNTER — Ambulatory Visit: Payer: Medicare HMO | Admitting: Cardiovascular Disease

## 2023-08-26 ENCOUNTER — Ambulatory Visit: Payer: Medicare HMO | Admitting: Podiatry

## 2023-08-27 ENCOUNTER — Other Ambulatory Visit: Payer: Self-pay | Admitting: Ophthalmology

## 2023-08-27 ENCOUNTER — Other Ambulatory Visit
Admission: RE | Admit: 2023-08-27 | Discharge: 2023-08-27 | Disposition: A | Discharge status: Home / Self Care | Payer: Medicare HMO | Attending: Ophthalmology | Admitting: Ophthalmology

## 2023-08-27 DIAGNOSIS — H5713 Ocular pain, bilateral: Secondary | ICD-10-CM

## 2023-08-27 DIAGNOSIS — R519 Headache, unspecified: Secondary | ICD-10-CM | POA: Diagnosis not present

## 2023-08-27 DIAGNOSIS — H538 Other visual disturbances: Secondary | ICD-10-CM

## 2023-08-27 LAB — C-REACTIVE PROTEIN: CRP: 0.9 mg/dL (ref <1.0)

## 2023-08-27 LAB — SEDIMENTATION RATE: Sed Rate: 9 mm/h (ref 0–30)

## 2023-08-29 ENCOUNTER — Ambulatory Visit: Payer: Medicare HMO | Admitting: Neurology

## 2023-08-29 ENCOUNTER — Telehealth: Payer: Self-pay | Admitting: Neurology

## 2023-08-29 ENCOUNTER — Encounter: Payer: Self-pay | Admitting: Neurology

## 2023-08-29 VITALS — BP 130/78 | Ht 67.0 in | Wt 168.0 lb

## 2023-08-29 DIAGNOSIS — G9589 Other specified diseases of spinal cord: Secondary | ICD-10-CM | POA: Diagnosis not present

## 2023-08-29 DIAGNOSIS — M792 Neuralgia and neuritis, unspecified: Secondary | ICD-10-CM

## 2023-08-29 DIAGNOSIS — G959 Disease of spinal cord, unspecified: Secondary | ICD-10-CM

## 2023-08-29 DIAGNOSIS — R269 Unspecified abnormalities of gait and mobility: Secondary | ICD-10-CM | POA: Diagnosis not present

## 2023-08-29 MED ORDER — PREGABALIN 75 MG PO CAPS: 75.0000 mg | ORAL_CAPSULE | Freq: Two times a day (BID) | ORAL | 5 refills | Status: DC

## 2023-08-29 NOTE — Progress Notes (Signed)
[facility]  PATIENT: Diane Knapp DOB: 25-Apr-1966  REQUESTING CLINICIAN: Miki Kins, FNP HISTORY FROM: Patient  REASON FOR VISIT: History of cervical myelomalacia/ Abnormal gait.    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 In term of the herniation, patient report one morning, she woke up and could not move anything, she could only her neck, she denied any injury, no falls, nothing that could have caused the herniation. She had the surgery, recover but could not walk as previously, now has to use a assisted device. She reports since last year, she has reoccurrence of her neck pain, bilateral arms and legs pain and tingling. Denies any recent falls.     OTHER MEDICAL CONDITIONS: History of cervical myelopathy s/p surgery, chronic pain, Abnormal gait, Hypertension, Depression    REVIEW OF SYSTEMS: Full 14 system review of systems performed and negative with exception of: As noted in the HPI   ALLERGIES: Allergies  Allergen Reactions   Duloxetine Other (See Comments)   Cymbalta [Duloxetine Hcl] Other (See Comments)    hallucinations    HOME MEDICATIONS: Outpatient Medications Prior to Visit  Medication Sig Dispense Refill   amLODipine (NORVASC) 5 MG tablet Take 1 tablet (5 mg total) by mouth daily. 30 tablet 11   aspirin EC 81 MG tablet Take 1 tablet (81 mg total) by mouth daily. Swallow whole. 30 tablet 12   buPROPion ER (WELLBUTRIN SR) 100 MG 12 hr tablet Take 100 mg by mouth 2 (two) times daily.     Cholecalciferol (VITAMIN D3) 2000 units capsule Take 1 capsule (2,000 Units total) by  mouth daily. 30 capsule PRN   FLUoxetine (PROZAC) 40 MG capsule Take 40 mg by mouth daily.     isosorbide mononitrate (IMDUR) 30 MG 24 hr tablet Take 1 tablet (30 mg total) by mouth daily. 30 tablet 1   lisinopril (ZESTRIL) 20 MG tablet Take 1 tablet (20 mg total) by mouth daily. 30 tablet 11   OLANZapine (ZYPREXA) 5 MG tablet Take 5 mg by mouth at bedtime.     prazosin (MINIPRESS) 1 MG capsule Take 1 mg by mouth at bedtime.     propranolol (INDERAL) 10 MG tablet Take 1 tablet (10 mg total) by mouth 2 (two) times daily. 60 tablet 0   No facility-administered medications prior to visit.    PAST MEDICAL HISTORY: Past Medical History:  Diagnosis Date   Anxiety    Callus of foot    right bottom of foot- removed 11/24/15   Chronic cervical radicular pain (Right) 10/12/2015   COPD (chronic obstructive pulmonary disease) (HCC)    Depression    Encounter for long-term (current) use of medications 08/31/2015   Encounter for therapeutic drug level monitoring 11/24/2015   Hypomagnesemia 10/27/2015   Marijuana use (see 11/24/2015 UDS) 08/31/2015   Myelomalacia (HCC)    Noncompliance 08/31/2015   Patient previously discharged from [facility] pain clinic secondary to missing appointments. 10/12/2015 - patient is taking more medication than prescribed. 12/22/2015 - opioid analgesic pain management treatment option terminated today due to noncompliance with medication policy and signed medication agreement. Patient tested  positive for cannabinoids on the 11/24/2015 UDS. [facility]   Noncompliance with medication treatment due to overuse of medication 10/12/2015   Spondylosis    Substance use disorder Risk: HIGH 08/31/2015   Previously found to have a UDS positive for unreported use of ETOH and cannabinoids on 12/07/2013.       PAST SURGICAL HISTORY: Past Surgical History:  Procedure Laterality Date   RIGHT OOPHORECTOMY     SPINE SURGERY      FAMILY HISTORY: Family History  Problem  Relation Age of Onset   Diabetes Mother    Cancer Mother    Stroke Mother    Hypertension Mother     SOCIAL HISTORY: Social History   Socioeconomic History   Marital status: Single    Spouse name: Not on file   Number of children: Not on file   Years of education: Not on file   Highest education level: Not on file  Occupational History   Not on file  Tobacco Use   Smoking status: Every Day    Current packs/day: 1.00    Types: Cigarettes   Smokeless tobacco: Not on file  Substance and Sexual Activity   Alcohol use: Yes    Alcohol/week: 1.0 standard drink of alcohol    Types: 1 Shots of liquor per week   Drug use: No   Sexual activity: Not on file  Other Topics Concern   Not on file  Social History Narrative   Left handed   Caffeine 6-8 cups daily   Homeless, lives in her car   Social Determinants of Health   Financial Resource Strain: Not on file  Food Insecurity: Not on file  Transportation Needs: Not on file  Physical Activity: Not on file  Stress: Not on file  Social Connections: Not on file  Intimate Partner Violence: Not on file     PHYSICAL EXAM  GENERAL EXAM/CONSTITUTIONAL: Vitals:  Vitals:   08/29/23 1415  BP: 130/78  Weight: 168 lb (76.2 kg)  Height: 5\' 7"  (1.702 m)   Body mass index is 26.31 kg/m. Wt Readings from Last 3 Encounters:  08/29/23 168 lb (76.2 kg)  06/11/23 163 lb 12.8 oz (74.3 kg)  05/28/23 168 lb 3.2 oz (76.3 kg)   Patient is in no distress; well developed, nourished and groomed; neck is supple  MUSCULOSKELETAL: Gait, strength, tone, movements noted in Neurologic exam below  NEUROLOGIC: MENTAL STATUS:      No data to display         awake, alert, oriented to person, place and time recent and remote memory intact normal attention and concentration language fluent, comprehension intact, naming intact fund of knowledge appropriate  CRANIAL NERVE:  2nd, 3rd, 4th, 6th - Visual fields full to confrontation,  extraocular muscles intact, no nystagmus 5th - facial sensation symmetric 7th - facial strength symmetric 8th - hearing intact 9th - palate elevates symmetrically, uvula midline 11th - shoulder shrug symmetric 12th - tongue protrusion midline  MOTOR:  normal bulk and tone, full strength in the BUE, BLE  SENSORY:  normal and symmetric to light touch, decrease vibration to bilateral feet up to ankle, decrease pinprick up to mid foot bilaterally.   COORDINATION:  finger-nose-finger, fine finger movements normal  REFLEXES:  deep tendon reflexes present and symmetric in the BUE but hyperreflexic 3+ in the bilateral patellar   GAIT/STATION:  Antalgic gait, circumduction gait, uses a cane for ambulation.      DIAGNOSTIC DATA (LABS, IMAGING, TESTING) -  I reviewed patient records, labs, notes, testing and imaging myself where available.  Lab Results  Component Value Date   WBC 8.7 04/03/2012   HGB 11.4 (L) 04/03/2012   HCT 36.2 04/03/2012   MCV 88 04/03/2012   PLT 246 04/03/2012      Component Value Date/Time   NA 142 05/27/2023 1431   NA 137 04/16/2014 1633   K 3.6 05/27/2023 1431   K 3.2 (L) 04/16/2014 1633   CL 105 05/27/2023 1431   CL 105 04/16/2014 1633   CO2 22 05/27/2023 1431   CO2 28 04/16/2014 1633   GLUCOSE 89 05/27/2023 1431   GLUCOSE 104 (H) 10/13/2015 1107   GLUCOSE 85 04/16/2014 1633   BUN 7 05/27/2023 1431   BUN 6 (L) 04/16/2014 1633   CREATININE 0.94 05/27/2023 1431   CREATININE 0.75 04/16/2014 1633   CALCIUM 9.5 05/27/2023 1431   CALCIUM 9.3 04/16/2014 1633   PROT 6.8 05/27/2023 1431   PROT 7.9 04/16/2014 1633   ALBUMIN 4.3 05/27/2023 1431   ALBUMIN 4.1 04/16/2014 1633   AST 15 05/27/2023 1431   AST 9 (L) 04/16/2014 1633   ALT 7 05/27/2023 1431   ALT 14 04/16/2014 1633   ALKPHOS 82 05/27/2023 1431   ALKPHOS 60 04/16/2014 1633   BILITOT 0.3 05/27/2023 1431   BILITOT 0.6 04/16/2014 1633   GFRNONAA >60 10/13/2015 1107   GFRNONAA >60 04/16/2014  1633   GFRAA >60 10/13/2015 1107   GFRAA >60 04/16/2014 1633   Lab Results  Component Value Date   CHOL 164 05/27/2023   HDL 51 05/27/2023   LDLCALC 93 05/27/2023   TRIG 108 05/27/2023   CHOLHDL 3.2 05/27/2023   Lab Results  Component Value Date   HGBA1C 5.7 (H) 05/27/2023   Lab Results  Component Value Date   VITAMINB12 551 05/27/2023   Lab Results  Component Value Date   TSH 1.150 05/27/2023      ASSESSMENT AND PLAN  57 y.o. 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 She is complaining of neck pain, on exam there is brisk 3+ reflexes in the BLEs, abnormal gait, no recent images available. I will start by getting MRI cervical spine and also MRI Brain r/o demyelinating disease. Will send the patient to PT and start her on Lyrica for pain. I will contact patient to go over the results. I will see her in 6 months for follow up or sooner if worse.    1. 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 Cervical myelopathy (HCC)   3. Gait abnormality   4. Neuropathic pain      Patient Instructions  MRI cervical spine  MRI Brain with and without contrast, rule demyelinating disease. Weakness, numbness, difficulty walking without history of trauma Referral to physical therapy  EMG/NCS in the BUEs  Trial of Lyrica 75 mg twice daily for neuropathic pain Return in 6 months or sooner if worse    Orders Placed This Encounter  Procedures   MR CERVICAL SPINE WO CONTRAST   MR BRAIN W WO CONTRAST   Ambulatory referral to Physical Therapy   NCV with EMG(electromyography)    Meds ordered this encounter  Medications   pregabalin (LYRICA) 75 MG capsule    Sig: Take 1 capsule (75 mg total) by mouth 2 (two) times daily.    Dispense:  60 capsule    Refill:  5    Return in about 6 months (around  02/26/2024).    Windell Norfolk, MD 08/30/2023, 8:57 AM  [facility] [address], [address] [place], [place] [address] [phone]

## 2023-08-29 NOTE — Telephone Encounter (Signed)
Submitted PA to [facility] but they need notes which are not finished yet. Tracking #[id number]

## 2023-08-30 NOTE — Patient Instructions (Addendum)
MRI cervical spine  MRI Brain with and without contrast, rule demyelinating disease. Weakness, numbness, difficulty walking without history of trauma Referral to physical therapy  EMG/NCS in the BUEs  Trial of Lyrica 75 mg twice daily for neuropathic pain Return in 6 months or sooner if worse

## 2023-09-03 ENCOUNTER — Telehealth: Payer: Self-pay | Admitting: Neurology

## 2023-09-03 NOTE — Telephone Encounter (Signed)
Referral for physical therapy faxed to [facility] Phone: [phone] Fax [phone]

## 2023-09-04 ENCOUNTER — Ambulatory Visit: Payer: Medicare HMO | Admitting: Podiatry

## 2023-09-04 NOTE — Telephone Encounter (Signed)
Office notes have been uploaded to [employer].

## 2023-09-05 NOTE — Telephone Encounter (Signed)
She is scheduled for both MRIs at [facility]. MR brain [facility] auth: [id number] exp. 09/05/23-11/04/23 MR cervical [facility] auth: [id number] exp. 08/29/23-10/28/23

## 2023-09-06 ENCOUNTER — Ambulatory Visit
Admission: RE | Admit: 2023-09-06 | Discharge: 2023-09-06 | Disposition: A | Discharge status: Home / Self Care | Payer: Medicare HMO | Source: Ambulatory Visit | Attending: Ophthalmology | Admitting: Ophthalmology

## 2023-09-06 ENCOUNTER — Other Ambulatory Visit: Payer: Self-pay | Admitting: Cardiovascular Disease

## 2023-09-06 DIAGNOSIS — H5713 Ocular pain, bilateral: Secondary | ICD-10-CM | POA: Insufficient documentation

## 2023-09-06 DIAGNOSIS — I10 Essential (primary) hypertension: Secondary | ICD-10-CM

## 2023-09-06 DIAGNOSIS — H538 Other visual disturbances: Secondary | ICD-10-CM | POA: Insufficient documentation

## 2023-09-06 DIAGNOSIS — R42 Dizziness and giddiness: Secondary | ICD-10-CM

## 2023-09-06 DIAGNOSIS — R0602 Shortness of breath: Secondary | ICD-10-CM

## 2023-09-06 DIAGNOSIS — E782 Mixed hyperlipidemia: Secondary | ICD-10-CM

## 2023-09-06 DIAGNOSIS — R519 Headache, unspecified: Secondary | ICD-10-CM | POA: Diagnosis not present

## 2023-09-06 DIAGNOSIS — R0789 Other chest pain: Secondary | ICD-10-CM

## 2023-09-06 DIAGNOSIS — J439 Emphysema, unspecified: Secondary | ICD-10-CM

## 2023-09-06 HISTORY — DX: Essential (primary) hypertension: I10

## 2023-09-06 MED ORDER — IOHEXOL 300 MG/ML  SOLN
75.0000 mL | Freq: Once | INTRAMUSCULAR | Status: AC | PRN
  Administered 2023-09-06: 75 mL via INTRAVENOUS

## 2023-09-09 ENCOUNTER — Telehealth: Payer: Self-pay | Admitting: Family

## 2023-09-09 NOTE — Telephone Encounter (Signed)
Patient called in stating she is scheduled for an MRI tomorrow at the hospital and she is claustrophobic. She is requesting we send her in something to calm her nerves so she can get in the machine. Please advise.  [facility]

## 2023-09-10 ENCOUNTER — Ambulatory Visit
Admission: RE | Admit: 2023-09-10 | Discharge: 2023-09-10 | Disposition: A | Discharge status: Home / Self Care | Payer: Medicare HMO | Source: Ambulatory Visit | Attending: Neurology | Admitting: Neurology

## 2023-09-10 ENCOUNTER — Ambulatory Visit: Payer: Medicare HMO | Admitting: Neurosurgery

## 2023-09-10 ENCOUNTER — Other Ambulatory Visit: Payer: Self-pay | Admitting: Family

## 2023-09-10 ENCOUNTER — Ambulatory Visit
Admission: RE | Admit: 2023-09-10 | Discharge: 2023-09-10 | Disposition: A | Discharge status: Home / Self Care | Payer: Medicare HMO | Source: Ambulatory Visit | Attending: Neurology

## 2023-09-10 DIAGNOSIS — M4802 Spinal stenosis, cervical region: Secondary | ICD-10-CM | POA: Diagnosis not present

## 2023-09-10 DIAGNOSIS — G9589 Other specified diseases of spinal cord: Secondary | ICD-10-CM | POA: Insufficient documentation

## 2023-09-10 DIAGNOSIS — R269 Unspecified abnormalities of gait and mobility: Secondary | ICD-10-CM

## 2023-09-10 DIAGNOSIS — M5001 Cervical disc disorder with myelopathy,  high cervical region: Secondary | ICD-10-CM | POA: Diagnosis not present

## 2023-09-10 DIAGNOSIS — G959 Disease of spinal cord, unspecified: Secondary | ICD-10-CM | POA: Insufficient documentation

## 2023-09-10 DIAGNOSIS — R9089 Other abnormal findings on diagnostic imaging of central nervous system: Secondary | ICD-10-CM | POA: Diagnosis not present

## 2023-09-10 DIAGNOSIS — M4803 Spinal stenosis, cervicothoracic region: Secondary | ICD-10-CM | POA: Diagnosis not present

## 2023-09-10 MED ORDER — ALPRAZOLAM 0.5 MG PO TABS: ORAL_TABLET | ORAL | 0 refills | Status: DC

## 2023-09-10 MED ORDER — GADOBUTROL 1 MMOL/ML IV SOLN
7.5000 mL | Freq: Once | INTRAVENOUS | Status: AC | PRN
  Administered 2023-09-10: 7.5 mL via INTRAVENOUS

## 2023-09-11 DIAGNOSIS — H5713 Ocular pain, bilateral: Secondary | ICD-10-CM | POA: Diagnosis not present

## 2023-09-11 DIAGNOSIS — H538 Other visual disturbances: Secondary | ICD-10-CM | POA: Diagnosis not present

## 2023-09-11 DIAGNOSIS — Z0100 Encounter for examination of eyes and vision without abnormal findings: Secondary | ICD-10-CM | POA: Diagnosis not present

## 2023-09-12 DIAGNOSIS — R682 Dry mouth, unspecified: Secondary | ICD-10-CM | POA: Diagnosis not present

## 2023-09-12 DIAGNOSIS — R059 Cough, unspecified: Secondary | ICD-10-CM | POA: Diagnosis not present

## 2023-09-12 DIAGNOSIS — H903 Sensorineural hearing loss, bilateral: Secondary | ICD-10-CM | POA: Diagnosis not present

## 2023-09-16 ENCOUNTER — Ambulatory Visit (INDEPENDENT_AMBULATORY_CARE_PROVIDER_SITE_OTHER): Payer: Medicare HMO | Admitting: Podiatry

## 2023-09-16 ENCOUNTER — Encounter: Payer: Self-pay | Admitting: Podiatry

## 2023-09-16 DIAGNOSIS — M7742 Metatarsalgia, left foot: Secondary | ICD-10-CM | POA: Diagnosis not present

## 2023-09-16 DIAGNOSIS — M7741 Metatarsalgia, right foot: Secondary | ICD-10-CM | POA: Diagnosis not present

## 2023-09-16 DIAGNOSIS — L852 Keratosis punctata (palmaris et plantaris): Secondary | ICD-10-CM | POA: Diagnosis not present

## 2023-09-16 NOTE — Progress Notes (Signed)
  Subjective:  Patient ID: Diane Knapp, female    DOB: June 11, 1966,  MRN: [id number]  Chief Complaint  Patient presents with   Callouses    "My foot hurts on the bottom and on the heel."   Foot Pain    "I get a tingly pain in my toes.  I'm Pre-Diabetic."    Discussed the use of AI scribe software for clinical note transcription with the patient, who gave verbal consent to proceed.  History of Present Illness   The patient presents with painful hyperkeratotic lesions on her feet, which she describes as 'very, very painful.' She has a large callus on the right foot that grows thick and makes it difficult to walk. She also has a painful lesion on the side of her foot. She has tried using a roll on pain reliever to manage the pain, but it only provides temporary relief. She also mentions having similar issues with her hands. She has tried various shoes to alleviate the discomfort, but all shoes seem to cause pain when walking. She also has a history of spinal issues, which may be exacerbating the foot pain.          Objective:    Physical Exam   CARDIOVASCULAR: Capillary refill time good. SKIN: Hyperkeratotic lesions on right foot and posterolateral heel, painful. Debris removed.       No images are attached to the encounter.    Results   Procedure: Debridement of hyperkeratotic lesions Description: Debrided the hyperkeratotic lesions to a tolerable level with a sharp scalpel      Assessment:   1. Keratosis palmo-plantaris punctata   2. Metatarsalgia of both feet      Plan:  Patient was evaluated and treated and all questions answered.  Assessment and Plan    Hyperkeratotic Lesions Debrided painful hyperkeratotic lesions on the right foot and posterolateral heel. We will apply 40% urea cream daily to prevent skin hardening and use a pumice stone regularly. Tuli's heel cup will address posterior heel pain, and a dancer's pad has been placed in the right shoe to offload  the submetatarsal lesion.  Neuropathic Pain and Fibromyalgia Pain is exacerbated by hyperkeratotic lesions and fibromyalgia. The current pain management regimen will continue. If the dancer's pad proves successful, we will consider making an accommodative orthosis. She will let me know if she would like to proceed with this in 1 month or later          Return if symptoms worsen or fail to improve.

## 2023-09-16 NOTE — Patient Instructions (Signed)
VISIT SUMMARY:  Today, we addressed the painful thickened skin on your feet, which has been causing you significant discomfort. We also discussed how your existing conditions, such as fibromyalgia and spinal issues, might be contributing to your pain.  YOUR PLAN:  -HYPERKERATOTIC LESIONS / METATARSALGIA: Hyperkeratotic lesions are areas of thickened skin that can become very painful. We have removed the thickened skin on your right foot and heel. To prevent the skin from hardening again, apply 40% urea cream daily and use a pumice stone regularly. We have also recommended using a Tuli's heel cup to help with heel pain and placed a dancer's pad in your right shoe to relieve pressure on the painful area under your foot. If the dancer's pad helps, we may consider making a custom orthotic insert for your shoes.    INSTRUCTIONS:  If the pad is helpful after the next month let me know if you would like to have an orthotic made. Not all insurance plans cover custom molded orthotics. Continue using the 40% urea cream daily and the pumice stone regularly. Use the Tuli's heel cup and dancer's pad as instructed.

## 2023-09-25 ENCOUNTER — Telehealth: Payer: Self-pay

## 2023-09-25 NOTE — Telephone Encounter (Signed)
Patient wanted to know if you were going to send in something for her to help her quit smoking. Please advise.

## 2023-10-01 ENCOUNTER — Telehealth: Payer: Self-pay

## 2023-10-01 NOTE — Telephone Encounter (Signed)
Patient has not heard anything from PT referral sent in 1 month ago. Asked by Dr. Teresa Coombs to follow up. Sent to [employer] in referrals

## 2023-10-09 ENCOUNTER — Other Ambulatory Visit: Payer: Self-pay | Admitting: Neurology

## 2023-10-09 DIAGNOSIS — M5412 Radiculopathy, cervical region: Secondary | ICD-10-CM

## 2023-10-09 DIAGNOSIS — G959 Disease of spinal cord, unspecified: Secondary | ICD-10-CM

## 2023-10-09 NOTE — Telephone Encounter (Signed)
Called pt to confirm if she has been scheduled for PT per referral. Unable to LVM due to it being full

## 2023-10-09 NOTE — Telephone Encounter (Signed)
Referral re faxed to [facility] Phone: [phone] Fax: [phone]

## 2023-10-14 DIAGNOSIS — H5711 Ocular pain, right eye: Secondary | ICD-10-CM | POA: Diagnosis not present

## 2023-10-14 NOTE — Telephone Encounter (Signed)
Referral for physical therapy re faxed to [facility] Phone: [phone] Fax [phone] on December 18.

## 2023-10-24 NOTE — Telephone Encounter (Signed)
 Sent portal message.

## 2023-10-29 ENCOUNTER — Encounter: Payer: Self-pay | Admitting: Family

## 2023-10-29 ENCOUNTER — Ambulatory Visit: Payer: 59 | Admitting: Family

## 2023-10-29 VITALS — BP 144/88 | HR 68 | Ht 67.0 in | Wt 165.6 lb

## 2023-10-29 DIAGNOSIS — I10 Essential (primary) hypertension: Secondary | ICD-10-CM | POA: Diagnosis not present

## 2023-10-29 DIAGNOSIS — E538 Deficiency of other specified B group vitamins: Secondary | ICD-10-CM | POA: Diagnosis not present

## 2023-10-29 DIAGNOSIS — F331 Major depressive disorder, recurrent, moderate: Secondary | ICD-10-CM

## 2023-10-29 DIAGNOSIS — R202 Paresthesia of skin: Secondary | ICD-10-CM | POA: Diagnosis not present

## 2023-10-29 DIAGNOSIS — E559 Vitamin D deficiency, unspecified: Secondary | ICD-10-CM | POA: Diagnosis not present

## 2023-10-29 DIAGNOSIS — R488 Other symbolic dysfunctions: Secondary | ICD-10-CM

## 2023-10-29 DIAGNOSIS — R1319 Other dysphagia: Secondary | ICD-10-CM

## 2023-10-29 DIAGNOSIS — E782 Mixed hyperlipidemia: Secondary | ICD-10-CM

## 2023-10-29 DIAGNOSIS — R7303 Prediabetes: Secondary | ICD-10-CM | POA: Diagnosis not present

## 2023-10-29 DIAGNOSIS — Z0183 Encounter for blood typing: Secondary | ICD-10-CM | POA: Diagnosis not present

## 2023-10-29 DIAGNOSIS — H5213 Myopia, bilateral: Secondary | ICD-10-CM | POA: Diagnosis not present

## 2023-10-29 DIAGNOSIS — R5383 Other fatigue: Secondary | ICD-10-CM | POA: Diagnosis not present

## 2023-10-29 MED ORDER — MIRTAZAPINE 15 MG PO TABS: 15.0000 mg | ORAL_TABLET | Freq: Every day | ORAL | 1 refills | Status: DC

## 2023-10-30 DIAGNOSIS — H53149 Visual discomfort, unspecified: Secondary | ICD-10-CM | POA: Diagnosis not present

## 2023-10-30 DIAGNOSIS — H5711 Ocular pain, right eye: Secondary | ICD-10-CM | POA: Diagnosis not present

## 2023-10-30 DIAGNOSIS — H538 Other visual disturbances: Secondary | ICD-10-CM | POA: Diagnosis not present

## 2023-10-30 DIAGNOSIS — H5713 Ocular pain, bilateral: Secondary | ICD-10-CM | POA: Diagnosis not present

## 2023-10-31 LAB — ALLERGEN FOOD PROFILE SPECIFIC IGE
Allergen Apple, IgE: <0.1 kU/L
Allergen Corn, IgE: <0.1 kU/L
Allergen Tomato, IgE: <0.1 kU/L
Chicken IgE: <0.1 kU/L
Codfish IgE: <0.1 kU/L
Egg White IgE: <0.1 kU/L
IgE (Immunoglobulin E), Serum: 69 [IU]/mL (ref 6–495)
Milk IgE: <0.1 kU/L
Orange: <0.1 kU/L
Peanut IgE: <0.1 kU/L
Shrimp IgE: <0.1 kU/L
Soybean IgE: <0.1 kU/L
Tuna: <0.1 kU/L
Wheat IgE: <0.1 kU/L

## 2023-10-31 LAB — ABO AND RH: Rh Factor: POSITIVE

## 2023-10-31 LAB — VITAMIN B12: Vitamin B-12: 632 pg/mL (ref 232–1245)

## 2023-10-31 LAB — CMP14+EGFR
ALT: 12 [IU]/L (ref 0–32)
AST: 14 [IU]/L (ref 0–40)
Albumin: 4.6 g/dL (ref 3.8–4.9)
Alkaline Phosphatase: 75 [IU]/L (ref 44–121)
BUN/Creatinine Ratio: 10 (ref 9–23)
BUN: 9 mg/dL (ref 6–24)
Bilirubin Total: 0.4 mg/dL (ref 0.0–1.2)
CO2: 24 mmol/L (ref 20–29)
Calcium: 9.4 mg/dL (ref 8.7–10.2)
Chloride: 104 mmol/L (ref 96–106)
Creatinine, Ser: 0.91 mg/dL (ref 0.57–1.00)
Globulin, Total: 2.5 g/dL (ref 1.5–4.5)
Glucose: 68 mg/dL — ABNORMAL LOW (ref 70–99)
Potassium: 4.3 mmol/L (ref 3.5–5.2)
Sodium: 141 mmol/L (ref 134–144)
Total Protein: 7.1 g/dL (ref 6.0–8.5)
eGFR: 74 mL/min/{1.73_m2} (ref >59)

## 2023-10-31 LAB — CBC WITH DIFFERENTIAL/PLATELET
Basophils Absolute: 0.1 10*3/uL (ref 0.0–0.2)
Basos: 1 %
EOS (ABSOLUTE): 0.1 10*3/uL (ref 0.0–0.4)
Eos: 2 %
Hematocrit: 41 % (ref 34.0–46.6)
Hemoglobin: 13.7 g/dL (ref 11.1–15.9)
Immature Grans (Abs): 0 10*3/uL (ref 0.0–0.1)
Immature Granulocytes: 0 %
Lymphocytes Absolute: 3.4 10*3/uL — ABNORMAL HIGH (ref 0.7–3.1)
Lymphs: 42 %
MCH: 30.1 pg (ref 26.6–33.0)
MCHC: 33.4 g/dL (ref 31.5–35.7)
MCV: 90 fL (ref 79–97)
Monocytes Absolute: 0.7 10*3/uL (ref 0.1–0.9)
Monocytes: 8 %
Neutrophils Absolute: 3.8 10*3/uL (ref 1.4–7.0)
Neutrophils: 47 %
Platelets: 276 10*3/uL (ref 150–450)
RBC: 4.55 x10E6/uL (ref 3.77–5.28)
RDW: 13.8 % (ref 11.7–15.4)
WBC: 8 10*3/uL (ref 3.4–10.8)

## 2023-10-31 LAB — LIPID PANEL
Chol/HDL Ratio: 3.1 {ratio} (ref 0.0–4.4)
Cholesterol, Total: 185 mg/dL (ref 100–199)
HDL: 59 mg/dL (ref >39)
LDL Chol Calc (NIH): 102 mg/dL — ABNORMAL HIGH (ref 0–99)
Triglycerides: 136 mg/dL (ref 0–149)
VLDL Cholesterol Cal: 24 mg/dL (ref 5–40)

## 2023-10-31 LAB — IRON,TIBC AND FERRITIN PANEL
Ferritin: 117 ng/mL (ref 15–150)
Iron Saturation: 23 % (ref 15–55)
Iron: 94 ug/dL (ref 27–159)
Total Iron Binding Capacity: 402 ug/dL (ref 250–450)
UIBC: 308 ug/dL (ref 131–425)

## 2023-10-31 LAB — VITAMIN D 25 HYDROXY (VIT D DEFICIENCY, FRACTURES): Vit D, 25-Hydroxy: 17.7 ng/mL — ABNORMAL LOW (ref 30.0–100.0)

## 2023-10-31 LAB — HEMOGLOBIN A1C
Est. average glucose Bld gHb Est-mCnc: 123 mg/dL
Hgb A1c MFr Bld: 5.9 % — ABNORMAL HIGH (ref 4.8–5.6)

## 2023-10-31 LAB — TSH: TSH: 0.62 u[IU]/mL (ref 0.450–4.500)

## 2023-11-05 ENCOUNTER — Ambulatory Visit (INDEPENDENT_AMBULATORY_CARE_PROVIDER_SITE_OTHER): Payer: 59 | Admitting: Neurology

## 2023-11-05 DIAGNOSIS — G959 Disease of spinal cord, unspecified: Secondary | ICD-10-CM

## 2023-11-05 DIAGNOSIS — Z0289 Encounter for other administrative examinations: Secondary | ICD-10-CM

## 2023-11-05 DIAGNOSIS — G9589 Other specified diseases of spinal cord: Secondary | ICD-10-CM

## 2023-11-05 DIAGNOSIS — R269 Unspecified abnormalities of gait and mobility: Secondary | ICD-10-CM

## 2023-11-05 DIAGNOSIS — M792 Neuralgia and neuritis, unspecified: Secondary | ICD-10-CM

## 2023-11-05 NOTE — Progress Notes (Signed)
 Full Name: Diane Knapp Gender: Female MRN #: [id number] Date of Birth: 22-Nov-1965    Visit Date: 11/05/2023 08:32 Age: 58 Years Examining Physician: Dr. Onetha Epp Referring Physician: Dr. Pastor Falling Height: 5 feet 7 inch  History: Here for evaluation of neck pain and low back pain. Patient states her symptoms are worse on the right arm. Left overall asymptomatic. She has cramps in her fingers, she has to straighten out the fingers to unlock it. She states numbness and tingling in the right finger tips all of them. Pain shoots from the right arm into the hand. The left side is not affected like the right. Where on the right side the whole right foot is also affected and she has LBP and radicular symptoms from the leg, down the back or side of the leg to the right foot.     Summary: Nerve conduction studies were performed on the bilateral upper extremities and right lower extremity.  EMG needle study was extremely limited, patient could not tolerate and asked to stop.All nerves and muscles (as indicated in the following tables) were within normal limits.    Conclusion: Nerve conduction studies performed in the bilateral upper and right lower extremities were within normal limits.  EMG was normal however extremely limited(one muscle) because patient could not tolerate needle exam and asked to stop.  This is a normal but limited examination.     ------------------------------- Onetha Epp, M.D.  [facility] [address], [address] [place], [place] [address] Tel: [phone] Fax: [phone]  Verbal informed consent was obtained from the patient, patient was informed of potential risk of procedure, including bruising, bleeding, hematoma formation, infection, muscle weakness, muscle pain, numbness, among others.        MNC    Nerve / Sites Muscle Latency Ref. Amplitude Ref. Rel Amp Segments Distance Velocity Ref. Area    ms ms mV mV %  cm m/s m/s mVms   R Median - APB     Wrist APB 3.3 <=4.4 7.8 >=4.0 100 Wrist - APB 7   29.0     Upper arm APB 6.8  6.9  88.3 Upper arm - Wrist 21 60 >=49 25.8  L Median - APB     Wrist APB 2.9 <=4.4 8.0 >=4.0 100 Wrist - APB 7   27.2     Upper arm APB 7.0  7.8  97.4 Upper arm - Wrist 24.6 60 >=49 22.9  R Ulnar - ADM     Wrist ADM 2.8 <=3.3 8.6 >=6.0 100 Wrist - ADM 7   26.4     B.Elbow ADM 4.6  5.0  57.9 B.Elbow - Wrist 11 58 >=49 17.1     A.Elbow ADM 7.3  6.7  136 A.Elbow - B.Elbow 15 58 >=49 22.8  L Ulnar - ADM     Wrist ADM 2.4 <=3.3 8.9 >=6.0 100 Wrist - ADM 7   30.4     B.Elbow ADM 4.1  8.3  92.6 B.Elbow - Wrist 11 65 >=49 29.3     A.Elbow ADM 6.9  7.8  93.8 A.Elbow - B.Elbow 16.6 58 >=49 29.8  R Peroneal - EDB     Ankle EDB 4.9 <=6.5 13.1 >=2.0 100 Ankle - EDB 9   51.7     Fib head EDB 12.0  11.8  89.7 Fib head - Ankle 32 45 >=44 48.2     Pop fossa EDB 13.8  11.5  98.2 Pop fossa -  Fib head 10 54 >=44 48.0         Pop fossa - Ankle      R Tibial - AH     Ankle AH 5.4 <=5.8 7.2 >=4.0 100 Ankle - AH 9   27.6     Pop fossa AH 14.2  6.9  96.1 Pop fossa - Ankle 39 44 >=41 28.6                 SNC    Nerve / Sites Rec. Site Peak Lat Ref.  Amp Ref. Segments Distance Peak Diff Ref.    ms ms V V  cm ms ms  R Sural - Ankle (Calf)     Calf Ankle 4.1 <=4.4 25 >=6 Calf - Ankle 14    R Superficial peroneal - Ankle     Lat leg Ankle 3.9 <=4.4 16 >=6 Lat leg - Ankle 14    R Median, Ulnar - Transcarpal comparison     Median Palm Wrist 2.1 <=2.2 42 >=35 Median Palm - Wrist 8       Ulnar Palm Wrist 1.8 <=2.2 18 >=12 Ulnar Palm - Wrist 8          Median Palm - Ulnar Palm  0.3 <=0.4  L Median, Ulnar - Transcarpal comparison     Median Palm Wrist 2.0 <=2.2 74 >=35 Median Palm - Wrist 8       Ulnar Palm Wrist 1.9 <=2.2 32 >=12 Ulnar Palm - Wrist 8          Median Palm - Ulnar Palm  0.1 <=0.4  R Median - Orthodromic (Dig II, Mid palm)     Dig II Wrist 3.1 <=3.4 23 >=10 Dig II - Wrist 13    L Median -  Orthodromic (Dig II, Mid palm)     Dig II Wrist 2.9 <=3.4 35 >=10 Dig II - Wrist 13    R Ulnar - Orthodromic, (Dig V, Mid palm)     Dig V Wrist 2.6 <=3.1 18 >=5 Dig V - Wrist 11    L Ulnar - Orthodromic, (Dig V, Mid palm)     Dig V Wrist 2.7 <=3.1 9 >=5 Dig V - Wrist 72                       F  Wave    Nerve F Lat Ref.   ms ms  R Ulnar - ADM 27.4 <=32.0  L Ulnar - ADM 26.5 <=32.0  R Tibial - AH 55.4 <=56.0           EMG Summary Table    Spontaneous MUAP Recruitment  Muscle IA Fib PSW Fasc Other Amp Dur. Poly Pattern  R. Deltoid Normal None None None _______ Normal Normal Normal Normal

## 2023-11-07 NOTE — Progress Notes (Signed)
See procedure note.

## 2023-11-09 NOTE — Procedures (Signed)
 Full Name: Diane Knapp Gender: Female MRN #: [id number] Date of Birth: 07/09/1966    Visit Date: 11/05/2023 08:32 Age: 58 Years Examining Physician: Dr. Naomie Dean Referring Physician: Dr. Windell Norfolk Height: 5 feet 7 inch  History: Here for evaluation of neck pain and low back pain. Patient states her symptoms are worse on the right arm. Left overall asymptomatic. She has cramps in her fingers, she has to straighten out the fingers to unlock it. She states numbness and tingling in the right finger tips all of them. Pain shoots from the right arm into the hand. The left side is not affected like the right. Where on the right side the whole right foot is also affected and she has LBP and radicular symptoms from the leg, down the back or side of the leg to the right foot.     Summary: Nerve conduction studies were performed on the bilateral upper extremities and right lower extremity.  EMG needle study was extremely limited, patient could not tolerate and asked to stop.All nerves and muscles (as indicated in the following tables) were within normal limits.    Conclusion: Nerve conduction studies performed in the bilateral upper and right lower extremities were within normal limits.  EMG was normal however extremely limited(one muscle) because patient could not tolerate needle exam and asked to stop.  This is a normal but limited examination.     ------------------------------- Naomie Dean, M.D.  [facility] [address] [place], [place] [address] Tel: [phone] Fax: [phone]  Verbal informed consent was obtained from the patient, patient was informed of potential risk of procedure, including bruising, bleeding, hematoma formation, infection, muscle weakness, muscle pain, numbness, among others.        MNC    Nerve / Sites Muscle Latency Ref. Amplitude Ref. Rel Amp Segments Distance Velocity Ref. Area    ms ms mV mV %  cm m/s m/s mVms   R Median - APB     Wrist APB 3.3 <=4.4 7.8 >=4.0 100 Wrist - APB 7   29.0     Upper arm APB 6.8  6.9  88.3 Upper arm - Wrist 21 60 >=49 25.8  L Median - APB     Wrist APB 2.9 <=4.4 8.0 >=4.0 100 Wrist - APB 7   27.2     Upper arm APB 7.0  7.8  97.4 Upper arm - Wrist 24.6 60 >=49 22.9  R Ulnar - ADM     Wrist ADM 2.8 <=3.3 8.6 >=6.0 100 Wrist - ADM 7   26.4     B.Elbow ADM 4.6  5.0  57.9 B.Elbow - Wrist 11 58 >=49 17.1     A.Elbow ADM 7.3  6.7  136 A.Elbow - B.Elbow 15 58 >=49 22.8  L Ulnar - ADM     Wrist ADM 2.4 <=3.3 8.9 >=6.0 100 Wrist - ADM 7   30.4     B.Elbow ADM 4.1  8.3  92.6 B.Elbow - Wrist 11 65 >=49 29.3     A.Elbow ADM 6.9  7.8  93.8 A.Elbow - B.Elbow 16.6 58 >=49 29.8  R Peroneal - EDB     Ankle EDB 4.9 <=6.5 13.1 >=2.0 100 Ankle - EDB 9   51.7     Fib head EDB 12.0  11.8  89.7 Fib head - Ankle 32 45 >=44 48.2     Pop fossa EDB 13.8  11.5  98.2 Pop fossa -  Fib head 10 54 >=44 48.0         Pop fossa - Ankle      R Tibial - AH     Ankle AH 5.4 <=5.8 7.2 >=4.0 100 Ankle - AH 9   27.6     Pop fossa AH 14.2  6.9  96.1 Pop fossa - Ankle 39 44 >=41 28.6                 SNC    Nerve / Sites Rec. Site Peak Lat Ref.  Amp Ref. Segments Distance Peak Diff Ref.    ms ms V V  cm ms ms  R Sural - Ankle (Calf)     Calf Ankle 4.1 <=4.4 25 >=6 Calf - Ankle 14    R Superficial peroneal - Ankle     Lat leg Ankle 3.9 <=4.4 16 >=6 Lat leg - Ankle 14    R Median, Ulnar - Transcarpal comparison     Median Palm Wrist 2.1 <=2.2 42 >=35 Median Palm - Wrist 8       Ulnar Palm Wrist 1.8 <=2.2 18 >=12 Ulnar Palm - Wrist 8          Median Palm - Ulnar Palm  0.3 <=0.4  L Median, Ulnar - Transcarpal comparison     Median Palm Wrist 2.0 <=2.2 74 >=35 Median Palm - Wrist 8       Ulnar Palm Wrist 1.9 <=2.2 32 >=12 Ulnar Palm - Wrist 8          Median Palm - Ulnar Palm  0.1 <=0.4  R Median - Orthodromic (Dig II, Mid palm)     Dig II Wrist 3.1 <=3.4 23 >=10 Dig II - Wrist 13    L Median -  Orthodromic (Dig II, Mid palm)     Dig II Wrist 2.9 <=3.4 35 >=10 Dig II - Wrist 13    R Ulnar - Orthodromic, (Dig V, Mid palm)     Dig V Wrist 2.6 <=3.1 18 >=5 Dig V - Wrist 11    L Ulnar - Orthodromic, (Dig V, Mid palm)     Dig V Wrist 2.7 <=3.1 9 >=5 Dig V - Wrist 72                       F  Wave    Nerve F Lat Ref.   ms ms  R Ulnar - ADM 27.4 <=32.0  L Ulnar - ADM 26.5 <=32.0  R Tibial - AH 55.4 <=56.0           EMG Summary Table    Spontaneous MUAP Recruitment  Muscle IA Fib PSW Fasc Other Amp Dur. Poly Pattern  R. Deltoid Normal None None None _______ Normal Normal Normal Normal

## 2023-11-11 ENCOUNTER — Other Ambulatory Visit: Payer: Self-pay

## 2023-11-11 MED ORDER — VITAMIN D (ERGOCALCIFEROL) 1.25 MG (50000 UNIT) PO CAPS: 50000.0000 [IU] | ORAL_CAPSULE | ORAL | 3 refills | Status: DC

## 2023-11-11 NOTE — Progress Notes (Signed)
Informed via Mychart message

## 2023-11-12 ENCOUNTER — Ambulatory Visit: Payer: 59 | Admitting: Family

## 2023-11-12 ENCOUNTER — Encounter: Payer: Self-pay | Admitting: Neurology

## 2023-11-18 ENCOUNTER — Ambulatory Visit (INDEPENDENT_AMBULATORY_CARE_PROVIDER_SITE_OTHER): Payer: 59 | Admitting: Family

## 2023-11-18 ENCOUNTER — Encounter: Payer: Self-pay | Admitting: Family

## 2023-11-18 VITALS — BP 140/88 | HR 70 | Ht 67.0 in | Wt 166.2 lb

## 2023-11-18 DIAGNOSIS — M792 Neuralgia and neuritis, unspecified: Secondary | ICD-10-CM

## 2023-11-18 DIAGNOSIS — I10 Essential (primary) hypertension: Secondary | ICD-10-CM

## 2023-11-18 DIAGNOSIS — G379 Demyelinating disease of central nervous system, unspecified: Secondary | ICD-10-CM | POA: Diagnosis not present

## 2023-11-18 MED ORDER — FLUOXETINE HCL 10 MG PO CAPS
10.0000 mg | ORAL_CAPSULE | Freq: Every day | ORAL | 2 refills | Status: DC
Start: 2023-11-18 — End: 2024-03-31

## 2023-11-18 MED ORDER — PREGABALIN 75 MG PO CAPS: 75.0000 mg | ORAL_CAPSULE | Freq: Two times a day (BID) | ORAL | 2 refills | Status: DC

## 2023-11-18 MED ORDER — NICOTINE 21 MG/24HR TD PT24: 21.0000 mg | MEDICATED_PATCH | TRANSDERMAL | 1 refills | Status: DC

## 2023-11-18 NOTE — Progress Notes (Unsigned)
Established Patient Office Visit  Subjective:  Patient ID: Diane Knapp, female    DOB: 1966/01/06  Age: 58 y.o. MRN: [id number]  Chief Complaint  Patient presents with  . Follow-up    Patient is here today for her 3 months follow up.  She has been feeling poorly since last appointment.   She does have additional concerns to discuss today.  She has had her EMG and was unable to tolerate the procedure, but she was told it was normal (they were only able to get 1 muscle).  She is still having significant issues with her pain and neurological issues.  She does not have a follow up scheduled until June.   Labs are not due today. She needs refills.   I have reviewed her active problem list, medication list, allergies, notes from last encounter, lab results for her appointment today.     No other concerns at this time.   Past Medical History:  Diagnosis Date  . Anxiety   . Callus of foot    right bottom of foot- removed 11/24/15  . Chronic cervical radicular pain (Right) 10/12/2015  . COPD (chronic obstructive pulmonary disease) (HCC)   . Depression   . Encounter for long-term (current) use of medications 08/31/2015  . Encounter for therapeutic drug level monitoring 11/24/2015  . Hypertension   . Hypomagnesemia 10/27/2015  . Marijuana use (see 11/24/2015 UDS) 08/31/2015  . Myelomalacia (HCC)   . Noncompliance 08/31/2015   Patient previously discharged from [facility] pain clinic secondary to missing appointments. 10/12/2015 - patient is taking more medication than prescribed. 12/22/2015 - opioid analgesic pain management treatment option terminated today due to noncompliance with medication policy and signed medication agreement. Patient tested positive for cannabinoids on the 11/24/2015 UDS. [facility]  . Noncompliance with medication treatment due to overuse of medication 10/12/2015  . Spondylosis   . Substance use disorder Risk: HIGH 08/31/2015   Previously  found to have a UDS positive for unreported use of ETOH and cannabinoids on 12/07/2013.       Past Surgical History:  Procedure Laterality Date  . RIGHT OOPHORECTOMY    . SPINE SURGERY      Social History   Socioeconomic History  . Marital status: Single    Spouse name: Not on file  . Number of children: Not on file  . Years of education: Not on file  . Highest education level: Not on file  Occupational History  . Not on file  Tobacco Use  . Smoking status: Every Day    Current packs/day: 1.00    Types: Cigarettes  . Smokeless tobacco: Not on file  Substance and Sexual Activity  . Alcohol use: Yes    Alcohol/week: 1.0 standard drink of alcohol    Types: 1 Shots of liquor per week    Comment: occasionally  . Drug use: No  . Sexual activity: Not on file  Other Topics Concern  . Not on file  Social History Narrative   Left handed   Caffeine 6-8 cups daily   Homeless, lives in her car   Social Drivers of Health   Financial Resource Strain: Not on file  Food Insecurity: Not on file  Transportation Needs: Not on file  Physical Activity: Not on file  Stress: Not on file  Social Connections: Not on file  Intimate Partner Violence: Not on file    Family History  Problem Relation Age of Onset  . Diabetes Mother   .  Cancer Mother   . Stroke Mother   . Hypertension Mother     Allergies  Allergen Reactions  . Duloxetine Other (See Comments)  . Cymbalta [Duloxetine Hcl] Other (See Comments)    hallucinations    ROS     Objective:   BP (!) 140/88   Pulse 70   Ht 5\' 7"  (1.702 m)   Wt 166 lb 3.2 oz (75.4 kg)   SpO2 99%   BMI 26.03 kg/m   Vitals:   11/18/23 1349  BP: (!) 140/88  Pulse: 70  Height: 5\' 7"  (1.702 m)  Weight: 166 lb 3.2 oz (75.4 kg)  SpO2: 99%  BMI (Calculated): 26.02    Physical Exam   No results found for any visits on 11/18/23.  Recent Results (from the past 2160 hours)  C-reactive protein     Status: None   Collection Time:  08/27/23  4:46 PM  Result Value Ref Range   CRP 0.9 <1.0 mg/dL    Comment: Performed at [facility], [address] [place], [place] [address]  Sedimentation rate     Status: None   Collection Time: 08/27/23  4:46 PM  Result Value Ref Range   Sed Rate 9 0 - 30 mm/hr    Comment: Performed at [facility], [address] [place], [place] [address]  Lipid panel     Status: Abnormal   Collection Time: 10/29/23 12:13 PM  Result Value Ref Range   Cholesterol, Total 185 100 - 199 mg/dL   Triglycerides 578 0 - 149 mg/dL   HDL 59 >46 mg/dL   VLDL Cholesterol Cal 24 5 - 40 mg/dL   LDL Chol Calc (NIH) 962 (H) 0 - 99 mg/dL   Chol/HDL Ratio 3.1 0.0 - 4.4 ratio    Comment:                                   T. Chol/HDL Ratio                                             Men  Women                               1/2 Avg.Risk  3.4    3.3                                   Avg.Risk  5.0    4.4                                2X Avg.Risk  9.6    7.1                                3X Avg.Risk 23.4   11.0   VITAMIN D 25 Hydroxy (Vit-D Deficiency, Fractures)     Status: Abnormal   Collection Time: 10/29/23 12:13 PM  Result Value Ref Range   Vit D, 25-Hydroxy 17.7 (L) 30.0 - 100.0 ng/mL    Comment: Vitamin D deficiency has been defined by the  Institute of Medicine and an Endocrine Society practice guideline as a level of serum 25-OH vitamin D less than 20 ng/mL (1,2). The Endocrine Society went on to further define vitamin D insufficiency as a level between 21 and 29 ng/mL (2). 1. IOM (Institute of Medicine). 2010. Dietary reference    intakes for calcium and D. [place]: The    [employer]. 2. Holick MF, Binkley , Bischoff-Ferrari HA, et al.    Evaluation, treatment, and prevention of vitamin D    deficiency: an Endocrine Society clinical practice    guideline. JCEM. 2011 Jul; 96(7):1911-30.   CMP14+EGFR     Status: Abnormal   Collection Time: 10/29/23 12:13 PM   Result Value Ref Range   Glucose 68 (L) 70 - 99 mg/dL   BUN 9 6 - 24 mg/dL   Creatinine, Ser 1.61 0.57 - 1.00 mg/dL   eGFR 74 >09 UE/AVW/0.98   BUN/Creatinine Ratio 10 9 - 23   Sodium 141 134 - 144 mmol/L   Potassium 4.3 3.5 - 5.2 mmol/L   Chloride 104 96 - 106 mmol/L   CO2 24 20 - 29 mmol/L   Calcium 9.4 8.7 - 10.2 mg/dL   Total Protein 7.1 6.0 - 8.5 g/dL   Albumin 4.6 3.8 - 4.9 g/dL   Globulin, Total 2.5 1.5 - 4.5 g/dL   Bilirubin Total 0.4 0.0 - 1.2 mg/dL   Alkaline Phosphatase 75 44 - 121 IU/L   AST 14 0 - 40 IU/L   ALT 12 0 - 32 IU/L  TSH     Status: None   Collection Time: 10/29/23 12:13 PM  Result Value Ref Range   TSH 0.620 0.450 - 4.500 uIU/mL  Hemoglobin A1c     Status: Abnormal   Collection Time: 10/29/23 12:13 PM  Result Value Ref Range   Hgb A1c MFr Bld 5.9 (H) 4.8 - 5.6 %    Comment:          Prediabetes: 5.7 - 6.4          Diabetes: >6.4          Glycemic control for adults with diabetes: <7.0    Est. average glucose Bld gHb Est-mCnc 123 mg/dL  Vitamin J19     Status: None   Collection Time: 10/29/23 12:13 PM  Result Value Ref Range   Vitamin B-12 632 232 - 1,245 pg/mL  CBC with Diff     Status: Abnormal   Collection Time: 10/29/23 12:13 PM  Result Value Ref Range   WBC 8.0 3.4 - 10.8 x10E3/uL   RBC 4.55 3.77 - 5.28 x10E6/uL   Hemoglobin 13.7 11.1 - 15.9 g/dL   Hematocrit 14.7 82.9 - 46.6 %   MCV 90 79 - 97 fL   MCH 30.1 26.6 - 33.0 pg   MCHC 33.4 31.5 - 35.7 g/dL   RDW 56.2 13.0 - 86.5 %   Platelets 276 150 - 450 x10E3/uL   Neutrophils 47 Not Estab. %   Lymphs 42 Not Estab. %   Monocytes 8 Not Estab. %   Eos 2 Not Estab. %   Basos 1 Not Estab. %   Neutrophils Absolute 3.8 1.4 - 7.0 x10E3/uL   Lymphocytes Absolute 3.4 (H) 0.7 - 3.1 x10E3/uL   Monocytes Absolute 0.7 0.1 - 0.9 x10E3/uL   EOS (ABSOLUTE) 0.1 0.0 - 0.4 x10E3/uL   Basophils Absolute 0.1 0.0 - 0.2 x10E3/uL   Immature Granulocytes 0 Not Estab. %   Immature Grans (Abs) 0.0  0.0 - 0.1  x10E3/uL  Iron, TIBC and Ferritin Panel     Status: None   Collection Time: 10/29/23 12:13 PM  Result Value Ref Range   Total Iron Binding Capacity 402 250 - 450 ug/dL   UIBC 161 096 - 045 ug/dL   Iron 94 27 - 409 ug/dL   Iron Saturation 23 15 - 55 %   Ferritin 117 15 - 150 ng/mL  Allergen food profile specific IgE (81191)     Status: None   Collection Time: 10/29/23 12:13 PM  Result Value Ref Range   Class Description Allergens Comment     Comment:     Levels of Specific IgE       Class  Description of Class     ---------------------------  -----  --------------------                    < 0.10         0         Negative            0.10 -    0.31         0/I       Equivocal/Low            0.32 -    0.55         I         Low            0.56 -    1.40         II        Moderate            1.41 -    3.90         III       High            3.91 -   19.00         IV        Very High           19.01 -  100.00         V         Very High                   >100.00         VI        Very High    IgE (Immunoglobulin E), Serum 69 6 - 495 IU/mL   Egg White IgE <0.10 Class 0 kU/L   Milk IgE <0.10 Class 0 kU/L   Codfish IgE <0.10 Class 0 kU/L   Wheat IgE <0.10 Class 0 kU/L   Allergen Corn, IgE <0.10 Class 0 kU/L   Peanut IgE <0.10 Class 0 kU/L   Soybean IgE <0.10 Class 0 kU/L   Shrimp IgE <0.10 Class 0 kU/L   Allergen Tomato, IgE <0.10 Class 0 kU/L   Orange <0.10 Class 0 kU/L   Tuna <0.10 Class 0 kU/L   Allergen Apple, IgE <0.10 Class 0 kU/L   Chicken IgE <0.10 Class 0 kU/L  ABO AND RH      Status: None   Collection Time: 10/29/23 12:13 PM  Result Value Ref Range   ABO Grouping O    Rh Factor Positive     Comment: Please note: Prior records for this patient's ABO / Rh type are not available for additional verification.        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   Total time spent: {AMA time spent:29001} minutes  Miki Kins, FNP  11/18/2023   This document may have been prepared by [facility] Voice Recognition software and as such may include unintentional dictation errors.

## 2023-11-20 ENCOUNTER — Encounter: Payer: Self-pay | Admitting: Family

## 2023-12-02 ENCOUNTER — Ambulatory Visit (INDEPENDENT_AMBULATORY_CARE_PROVIDER_SITE_OTHER): Payer: 59 | Admitting: Family

## 2023-12-02 ENCOUNTER — Encounter: Payer: Self-pay | Admitting: Family

## 2023-12-02 VITALS — BP 146/82 | HR 70 | Ht 67.0 in | Wt 169.0 lb

## 2023-12-02 DIAGNOSIS — M792 Neuralgia and neuritis, unspecified: Secondary | ICD-10-CM | POA: Diagnosis not present

## 2023-12-02 DIAGNOSIS — E559 Vitamin D deficiency, unspecified: Secondary | ICD-10-CM | POA: Diagnosis not present

## 2023-12-02 DIAGNOSIS — G379 Demyelinating disease of central nervous system, unspecified: Secondary | ICD-10-CM

## 2023-12-02 DIAGNOSIS — F331 Major depressive disorder, recurrent, moderate: Secondary | ICD-10-CM

## 2023-12-02 DIAGNOSIS — J439 Emphysema, unspecified: Secondary | ICD-10-CM

## 2023-12-02 DIAGNOSIS — G9589 Other specified diseases of spinal cord: Secondary | ICD-10-CM | POA: Diagnosis not present

## 2023-12-02 NOTE — Patient Instructions (Addendum)
 ONLY take your Mirtazapine  and Olanzapine at night.   Decrease the Mirtazapine  to 1/2 tablet, for a 7.5 mg dose instead of the full 15 mg.   Linzess samples: Take 1st thing in the morning on an empty stomach.  No food for at least 30 minutes after taking.

## 2023-12-02 NOTE — Progress Notes (Signed)
 Established Patient Office Visit  Subjective:  Patient ID: Diane Knapp, female    DOB: [birth year]  Age: 58 y.o. MRN: [id number]  Chief Complaint  Patient presents with   Follow-up    2 week follow up    Patient is here today for her 2 week follow up.  She has been feeling fairly well since last appointment.   She does not have additional concerns to discuss today. Since she got the lyrica RX, she has been feeling better.  She is doing well today.   Labs are not due today. She needs refills.   I have reviewed her active problem list, medication list, allergies, notes from last encounter, lab results for her appointment today.    No other concerns at this time.   Past Medical History:  Diagnosis Date   Anxiety    Callus of foot    right bottom of foot- removed 11/24/15   Chronic cervical radicular pain (Right) 10/12/2015   COPD (chronic obstructive pulmonary disease) (HCC)    Depression    Encounter for long-term (current) use of medications 08/31/2015   Encounter for therapeutic drug level monitoring 11/24/2015   Hypertension    Hypomagnesemia 10/27/2015   Marijuana use (see 11/24/2015 UDS) 08/31/2015   Myelomalacia (HCC)    Noncompliance 08/31/2015   Patient previously discharged from [facility] pain clinic secondary to missing appointments. 10/12/2015 - patient is taking more medication than prescribed. 12/22/2015 - opioid analgesic pain management treatment option terminated today due to noncompliance with medication policy and signed medication agreement. Patient tested positive for cannabinoids on the 11/24/2015 UDS. [facility]   Noncompliance with medication treatment due to overuse of medication 10/12/2015   Spondylosis    Substance use disorder Risk: HIGH 08/31/2015   Previously found to have a UDS positive for unreported use of ETOH and cannabinoids on 12/07/2013.       Past Surgical History:  Procedure Laterality Date   RIGHT OOPHORECTOMY      SPINE SURGERY      Social History   Socioeconomic History   Marital status: Legally Separated    Spouse name: Not on file   Number of children: 2   Years of education: Not on file   Highest education level: 11th grade  Occupational History   Not on file  Tobacco Use   Smoking status: Every Day    Current packs/day: 0.50    Types: Cigarettes   Smokeless tobacco: Not on file  Vaping Use   Vaping status: Never Used  Substance and Sexual Activity   Alcohol use: Yes    Alcohol/week: 1.0 standard drink of alcohol    Types: 1 Shots of liquor per week    Comment: occasionally   Drug use: Not Currently    Types: Marijuana   Sexual activity: Not Currently  Other Topics Concern   Not on file  Social History Narrative   Left handed   Caffeine 6-8 cups daily   Homeless, lives in her car   Social Drivers of Corporate investment banker Strain: Not on file  Food Insecurity: Not on file  Transportation Needs: Not on file  Physical Activity: Not on file  Stress: Not on file  Social Connections: Not on file  Intimate Partner Violence: Not on file    Family History  Problem Relation Age of Onset   Depression Mother    Diabetes Mother    Cancer Mother    Stroke Mother  Hypertension Mother    Schizophrenia Sister    Depression Sister    Drug abuse Brother    Depression Brother    Drug abuse Maternal Uncle    Drug abuse Paternal Uncle    Drug abuse Cousin     Allergies  Allergen Reactions   Duloxetine Other (See Comments)   Cymbalta [Duloxetine Hcl] Other (See Comments)    hallucinations    Review of Systems  Neurological:  Positive for weakness.  All other systems reviewed and are negative.      Objective:   BP (!) 146/82   Pulse 70   Ht 5\' 7"  (1.702 m)   Wt 169 lb (76.7 kg)   SpO2 98%   BMI 26.47 kg/m   Vitals:   12/02/23 1117  BP: (!) 146/82  Pulse: 70  Height: 5\' 7"  (1.702 m)  Weight: 169 lb (76.7 kg)  SpO2: 98%  BMI (Calculated): 26.46     Physical Exam Vitals and nursing note reviewed.  Constitutional:      Appearance: Normal appearance. She is normal weight.  HENT:     Head: Normocephalic.  Eyes:     Extraocular Movements: Extraocular movements intact.     Conjunctiva/sclera: Conjunctivae normal.     Pupils: Pupils are equal, round, and reactive to light.  Cardiovascular:     Rate and Rhythm: Normal rate.  Pulmonary:     Effort: Pulmonary effort is normal.  Neurological:     General: No focal deficit present.     Mental Status: She is alert and oriented to person, place, and time. Mental status is at baseline.  Psychiatric:        Mood and Affect: Mood normal.        Behavior: Behavior normal.        Thought Content: Thought content normal.        Judgment: Judgment normal.      No results found for any visits on 12/02/23.  No results found for this or any previous visit (from the past 2160 hours).      Assessment & Plan:   Problem List Items Addressed This Visit       Respiratory   Chronic obstructive pulmonary disease (COPD) (HCC)   Patient stable.  Well controlled with current therapy.   Continue current meds.          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  Well controlled with current therapy.   Continue current meds.        Demyelinating disease [facility])   Patient is seen by Neurology, who manage this condition.  She is well controlled with current therapy.   Will defer to them for further changes to plan of care.        Other   MDD (major depressive disorder), recurrent episode, moderate (HCC)   Patient is seen by Psychiatry, who manage this condition.  She is well controlled with current therapy.   Will defer to them for further changes to plan of care.       Neuropathic pain - Primary (Chronic)   Patient stable.  Well controlled with current therapy.   Continue current meds.        Vitamin D deficiency,  unspecified   Continue Vitamin D supplements.  Will recheck labs at her follow up appt         Return in about 2 months (around 01/30/2024) for F/U.   Total time  spent: 20 minutes  Trenda Frisk, FNP  12/02/2023   This document may have been prepared by [facility] Voice Recognition software and as such may include unintentional dictation errors.

## 2023-12-10 DIAGNOSIS — H16229 Keratoconjunctivitis sicca, not specified as Sjogren's, unspecified eye: Secondary | ICD-10-CM | POA: Diagnosis not present

## 2023-12-10 DIAGNOSIS — H5711 Ocular pain, right eye: Secondary | ICD-10-CM | POA: Diagnosis not present

## 2023-12-10 DIAGNOSIS — H538 Other visual disturbances: Secondary | ICD-10-CM | POA: Diagnosis not present

## 2023-12-10 DIAGNOSIS — H53149 Visual discomfort, unspecified: Secondary | ICD-10-CM | POA: Diagnosis not present

## 2023-12-27 ENCOUNTER — Encounter: Payer: Self-pay | Admitting: Family

## 2023-12-27 NOTE — Assessment & Plan Note (Signed)
 Blood pressure well controlled with current medications.  Continue current therapy.  Will reassess at follow up.

## 2023-12-27 NOTE — Progress Notes (Signed)
 Established Patient Office Visit  Subjective:  Patient ID: Diane Knapp, female    DOB: [birth year]  Age: 58 y.o. MRN: [id number]  Chief Complaint  Patient presents with   Follow-up    Discuss medications    Patient is here today to discuss her meds.  She has been having significant trouble with her neurological symptoms. Specifically today she has been having more trouble with her aphasia as well as her dysphagia. She has also been having significant insomnia.  She did go to neurology and had EMG testing done but she was unable to tolerate procedure and so they were only able to test 1 area. The muscle did test normal but they were unable to get readings for any others.  No other concerns at this time    No other concerns at this time.   Past Medical History:  Diagnosis Date   Anxiety    Callus of foot    right bottom of foot- removed 11/24/15   Chronic cervical radicular pain (Right) 10/12/2015   COPD (chronic obstructive pulmonary disease) (HCC)    Depression    Encounter for long-term (current) use of medications 08/31/2015   Encounter for therapeutic drug level monitoring 11/24/2015   Hypertension    Hypomagnesemia 10/27/2015   Marijuana use (see 11/24/2015 UDS) 08/31/2015   Myelomalacia (HCC)    Noncompliance 08/31/2015   Patient previously discharged from [facility] pain clinic secondary to missing appointments. 10/12/2015 - patient is taking more medication than prescribed. 12/22/2015 - opioid analgesic pain management treatment option terminated today due to noncompliance with medication policy and signed medication agreement. Patient tested positive for cannabinoids on the 11/24/2015 UDS. [facility]   Noncompliance with medication treatment due to overuse of medication 10/12/2015   Spondylosis    Substance use disorder Risk: HIGH 08/31/2015   Previously found to have a UDS positive for unreported use of ETOH and cannabinoids on 12/07/2013.        Past Surgical History:  Procedure Laterality Date   RIGHT OOPHORECTOMY     SPINE SURGERY      Social History   Socioeconomic History   Marital status: Single    Spouse name: Not on file   Number of children: Not on file   Years of education: Not on file   Highest education level: Not on file  Occupational History   Not on file  Tobacco Use   Smoking status: Every Day    Current packs/day: 1.00    Types: Cigarettes   Smokeless tobacco: Not on file  Substance and Sexual Activity   Alcohol use: Yes    Alcohol/week: 1.0 standard drink of alcohol    Types: 1 Shots of liquor per week    Comment: occasionally   Drug use: No   Sexual activity: Not on file  Other Topics Concern   Not on file  Social History Narrative   Left handed   Caffeine 6-8 cups daily   Homeless, lives in her car   Social Drivers of Corporate investment banker Strain: Not on file  Food Insecurity: Not on file  Transportation Needs: Not on file  Physical Activity: Not on file  Stress: Not on file  Social Connections: Not on file  Intimate Partner Violence: Not on file    Family History  Problem Relation Age of Onset   Diabetes Mother    Cancer Mother    Stroke Mother    Hypertension Mother     Allergies  Allergen Reactions   Duloxetine Other (See Comments)   Cymbalta [Duloxetine Hcl] Other (See Comments)    hallucinations    Review of Systems  Constitutional:  Positive for malaise/fatigue and weight loss.  Gastrointestinal:        Difficulty swallowing  Musculoskeletal:  Positive for joint pain and myalgias.  Neurological:  Positive for dizziness, speech change, weakness and headaches.  Psychiatric/Behavioral:  Positive for memory loss. The patient has insomnia.        Objective:   BP (!) 144/88   Pulse 68   Ht 5\' 7"  (1.702 m)   Wt 165 lb 9.6 oz (75.1 kg)   SpO2 98%   BMI 25.94 kg/m   Vitals:   10/29/23 1126  BP: (!) 144/88  Pulse: 68  Height: 5\' 7"  (1.702 m)   Weight: 165 lb 9.6 oz (75.1 kg)  SpO2: 98%  BMI (Calculated): 25.93    Physical Exam Constitutional:      General: She is not in acute distress.    Appearance: Normal appearance. She is ill-appearing. She is not toxic-appearing.  Neurological:     Mental Status: She is alert and oriented to person, place, and time.     Motor: Weakness present.     Gait: Gait abnormal.  Psychiatric:        Attention and Perception: Attention and perception normal.        Mood and Affect: Mood is anxious and depressed. Affect is tearful.        Behavior: Behavior normal.        Thought Content: Thought content normal.        Cognition and Memory: Memory is impaired. She exhibits impaired recent memory.        Judgment: Judgment normal.     Comments: Anomic aphasia      Results for orders placed or performed in visit on 10/29/23  Lipid panel  Result Value Ref Range   Cholesterol, Total 185 100 - 199 mg/dL   Triglycerides 409 0 - 149 mg/dL   HDL 59 >81 mg/dL   VLDL Cholesterol Cal 24 5 - 40 mg/dL   LDL Chol Calc (NIH) 191 (H) 0 - 99 mg/dL   Chol/HDL Ratio 3.1 0.0 - 4.4 ratio  VITAMIN D 25 Hydroxy (Vit-D Deficiency, Fractures)  Result Value Ref Range   Vit D, 25-Hydroxy 17.7 (L) 30.0 - 100.0 ng/mL  CMP14+EGFR  Result Value Ref Range   Glucose 68 (L) 70 - 99 mg/dL   BUN 9 6 - 24 mg/dL   Creatinine, Ser 4.78 0.57 - 1.00 mg/dL   eGFR 74 >29 FA/OZH/0.86   BUN/Creatinine Ratio 10 9 - 23   Sodium 141 134 - 144 mmol/L   Potassium 4.3 3.5 - 5.2 mmol/L   Chloride 104 96 - 106 mmol/L   CO2 24 20 - 29 mmol/L   Calcium 9.4 8.7 - 10.2 mg/dL   Total Protein 7.1 6.0 - 8.5 g/dL   Albumin 4.6 3.8 - 4.9 g/dL   Globulin, Total 2.5 1.5 - 4.5 g/dL   Bilirubin Total 0.4 0.0 - 1.2 mg/dL   Alkaline Phosphatase 75 44 - 121 IU/L   AST 14 0 - 40 IU/L   ALT 12 0 - 32 IU/L  TSH  Result Value Ref Range   TSH 0.620 0.450 - 4.500 uIU/mL  Hemoglobin A1c  Result Value Ref Range   Hgb A1c MFr Bld 5.9 (H) 4.8 -  5.6 %   Est. average glucose Bld gHb  Est-mCnc 123 mg/dL  Vitamin O13  Result Value Ref Range   Vitamin B-12 632 232 - 1,245 pg/mL  CBC with Diff  Result Value Ref Range   WBC 8.0 3.4 - 10.8 x10E3/uL   RBC 4.55 3.77 - 5.28 x10E6/uL   Hemoglobin 13.7 11.1 - 15.9 g/dL   Hematocrit 08.6 57.8 - 46.6 %   MCV 90 79 - 97 fL   MCH 30.1 26.6 - 33.0 pg   MCHC 33.4 31.5 - 35.7 g/dL   RDW 46.9 62.9 - 52.8 %   Platelets 276 150 - 450 x10E3/uL   Neutrophils 47 Not Estab. %   Lymphs 42 Not Estab. %   Monocytes 8 Not Estab. %   Eos 2 Not Estab. %   Basos 1 Not Estab. %   Neutrophils Absolute 3.8 1.4 - 7.0 x10E3/uL   Lymphocytes Absolute 3.4 (H) 0.7 - 3.1 x10E3/uL   Monocytes Absolute 0.7 0.1 - 0.9 x10E3/uL   EOS (ABSOLUTE) 0.1 0.0 - 0.4 x10E3/uL   Basophils Absolute 0.1 0.0 - 0.2 x10E3/uL   Immature Granulocytes 0 Not Estab. %   Immature Grans (Abs) 0.0 0.0 - 0.1 x10E3/uL  Iron, TIBC and Ferritin Panel  Result Value Ref Range   Total Iron Binding Capacity 402 250 - 450 ug/dL   UIBC 413 244 - 010 ug/dL   Iron 94 27 - 272 ug/dL   Iron Saturation 23 15 - 55 %   Ferritin 117 15 - 150 ng/mL  Allergen food profile specific IgE (53664)  Result Value Ref Range   Class Description Allergens Comment    IgE (Immunoglobulin E), Serum 69 6 - 495 IU/mL   Egg White IgE <0.10 Class 0 kU/L   Milk IgE <0.10 Class 0 kU/L   Codfish IgE <0.10 Class 0 kU/L   Wheat IgE <0.10 Class 0 kU/L   Allergen Corn, IgE <0.10 Class 0 kU/L   Peanut IgE <0.10 Class 0 kU/L   Soybean IgE <0.10 Class 0 kU/L   Shrimp IgE <0.10 Class 0 kU/L   Allergen Tomato, IgE <0.10 Class 0 kU/L   Orange <0.10 Class 0 kU/L   Tuna <0.10 Class 0 kU/L   Allergen Apple, IgE <0.10 Class 0 kU/L   Chicken IgE <0.10 Class 0 kU/L  ABO AND RH   Result Value Ref Range   ABO Grouping O    Rh Factor Positive     Recent Results (from the past 2160 hours)  Lipid panel     Status: Abnormal   Collection Time: 10/29/23 12:13 PM  Result Value Ref  Range   Cholesterol, Total 185 100 - 199 mg/dL   Triglycerides 403 0 - 149 mg/dL   HDL 59 >47 mg/dL   VLDL Cholesterol Cal 24 5 - 40 mg/dL   LDL Chol Calc (NIH) 425 (H) 0 - 99 mg/dL   Chol/HDL Ratio 3.1 0.0 - 4.4 ratio    Comment:                                   T. Chol/HDL Ratio                                             Men  Women  1/2 Avg.Risk  3.4    3.3                                   Avg.Risk  5.0    4.4                                2X Avg.Risk  9.6    7.1                                3X Avg.Risk 23.4   11.0   VITAMIN D 25 Hydroxy (Vit-D Deficiency, Fractures)     Status: Abnormal   Collection Time: 10/29/23 12:13 PM  Result Value Ref Range   Vit D, 25-Hydroxy 17.7 (L) 30.0 - 100.0 ng/mL    Comment: Vitamin D deficiency has been defined by the Institute of Medicine and an Endocrine Society practice guideline as a level of serum 25-OH vitamin D less than 20 ng/mL (1,2). The Endocrine Society went on to further define vitamin D insufficiency as a level between 21 and 29 ng/mL (2). 1. IOM (Institute of Medicine). 2010. Dietary reference    intakes for calcium and D. [place]: The    [employer]. 2. Holick MF, Binkley [place], Bischoff-Ferrari HA, et al.    Evaluation, treatment, and prevention of vitamin D    deficiency: an Endocrine Society clinical practice    guideline. JCEM. 2011 Jul; 96(7):1911-30.   CMP14+EGFR     Status: Abnormal   Collection Time: 10/29/23 12:13 PM  Result Value Ref Range   Glucose 68 (L) 70 - 99 mg/dL   BUN 9 6 - 24 mg/dL   Creatinine, Ser 1.61 0.57 - 1.00 mg/dL   eGFR 74 >09 UE/AVW/0.98   BUN/Creatinine Ratio 10 9 - 23   Sodium 141 134 - 144 mmol/L   Potassium 4.3 3.5 - 5.2 mmol/L   Chloride 104 96 - 106 mmol/L   CO2 24 20 - 29 mmol/L   Calcium 9.4 8.7 - 10.2 mg/dL   Total Protein 7.1 6.0 - 8.5 g/dL   Albumin 4.6 3.8 - 4.9 g/dL   Globulin, Total 2.5 1.5 - 4.5 g/dL   Bilirubin Total 0.4 0.0  - 1.2 mg/dL   Alkaline Phosphatase 75 44 - 121 IU/L   AST 14 0 - 40 IU/L   ALT 12 0 - 32 IU/L  TSH     Status: None   Collection Time: 10/29/23 12:13 PM  Result Value Ref Range   TSH 0.620 0.450 - 4.500 uIU/mL  Hemoglobin A1c     Status: Abnormal   Collection Time: 10/29/23 12:13 PM  Result Value Ref Range   Hgb A1c MFr Bld 5.9 (H) 4.8 - 5.6 %    Comment:          Prediabetes: 5.7 - 6.4          Diabetes: >6.4          Glycemic control for adults with diabetes: <7.0    Est. average glucose Bld gHb Est-mCnc 123 mg/dL  Vitamin J19     Status: None   Collection Time: 10/29/23 12:13 PM  Result Value Ref Range   Vitamin B-12 632 232 - 1,245 pg/mL  CBC with Diff     Status: Abnormal   Collection Time:  10/29/23 12:13 PM  Result Value Ref Range   WBC 8.0 3.4 - 10.8 x10E3/uL   RBC 4.55 3.77 - 5.28 x10E6/uL   Hemoglobin 13.7 11.1 - 15.9 g/dL   Hematocrit 28.3 15.1 - 46.6 %   MCV 90 79 - 97 fL   MCH 30.1 26.6 - 33.0 pg   MCHC 33.4 31.5 - 35.7 g/dL   RDW 76.1 60.7 - 37.1 %   Platelets 276 150 - 450 x10E3/uL   Neutrophils 47 Not Estab. %   Lymphs 42 Not Estab. %   Monocytes 8 Not Estab. %   Eos 2 Not Estab. %   Basos 1 Not Estab. %   Neutrophils Absolute 3.8 1.4 - 7.0 x10E3/uL   Lymphocytes Absolute 3.4 (H) 0.7 - 3.1 x10E3/uL   Monocytes Absolute 0.7 0.1 - 0.9 x10E3/uL   EOS (ABSOLUTE) 0.1 0.0 - 0.4 x10E3/uL   Basophils Absolute 0.1 0.0 - 0.2 x10E3/uL   Immature Granulocytes 0 Not Estab. %   Immature Grans (Abs) 0.0 0.0 - 0.1 x10E3/uL  Iron, TIBC and Ferritin Panel     Status: None   Collection Time: 10/29/23 12:13 PM  Result Value Ref Range   Total Iron Binding Capacity 402 250 - 450 ug/dL   UIBC 062 694 - 854 ug/dL   Iron 94 27 - 627 ug/dL   Iron Saturation 23 15 - 55 %   Ferritin 117 15 - 150 ng/mL  Allergen food profile specific IgE (03500)     Status: None   Collection Time: 10/29/23 12:13 PM  Result Value Ref Range   Class Description Allergens Comment     Comment:      Levels of Specific IgE       Class  Description of Class     ---------------------------  -----  --------------------                    < 0.10         0         Negative            0.10 -    0.31         0/I       Equivocal/Low            0.32 -    0.55         I         Low            0.56 -    1.40         II        Moderate            1.41 -    3.90         III       High            3.91 -   19.00         IV        Very High           19.01 -  100.00         V         Very High                   >100.00         VI        Very High    IgE (Immunoglobulin E), Serum 69 6 -  495 IU/mL   Egg White IgE <0.10 Class 0 kU/L   Milk IgE <0.10 Class 0 kU/L   Codfish IgE <0.10 Class 0 kU/L   Wheat IgE <0.10 Class 0 kU/L   Allergen Corn, IgE <0.10 Class 0 kU/L   Peanut IgE <0.10 Class 0 kU/L   Soybean IgE <0.10 Class 0 kU/L   Shrimp IgE <0.10 Class 0 kU/L   Allergen Tomato, IgE <0.10 Class 0 kU/L   Orange <0.10 Class 0 kU/L   Tuna <0.10 Class 0 kU/L   Allergen Apple, IgE <0.10 Class 0 kU/L   Chicken IgE <0.10 Class 0 kU/L  ABO AND RH      Status: None   Collection Time: 10/29/23 12:13 PM  Result Value Ref Range   ABO Grouping O    Rh Factor Positive     Comment: Please note: Prior records for this patient's ABO / Rh type are not available for additional verification.        Assessment & Plan:   Problem List Items Addressed This Visit       Cardiovascular and Mediastinum   Essential hypertension, benign   Blood pressure well controlled with current medications.  Continue current therapy.  Will reassess at follow up.      Relevant Orders   CMP14+EGFR (Completed)   CBC with Diff (Completed)     Other   Major depressive disorder, recurrent episode, moderate (HCC)   Relevant Medications   mirtazapine (REMERON) 15 MG tablet   Other Relevant Orders   CMP14+EGFR (Completed)   CBC with Diff (Completed)   Vitamin D deficiency, unspecified   Relevant Orders   VITAMIN D 25  Hydroxy (Vit-D Deficiency, Fractures) (Completed)   CMP14+EGFR (Completed)   CBC with Diff (Completed)   Other Visit Diagnoses       Mixed hyperlipidemia    -  Primary   Checking labs today.  Continue current therapy for lipid control. Will modify as needed based on labwork results.   Relevant Orders   Lipid panel (Completed)   CMP14+EGFR (Completed)   CBC with Diff (Completed)     Prediabetes       A1C is in prediabetic ranges. Patient counseled on dietary choices and verbalized understanding. Will reassess at follow up after next lab check.   Relevant Orders   CMP14+EGFR (Completed)   Hemoglobin A1c (Completed)   CBC with Diff (Completed)     B12 deficiency due to diet       Checking labs today.  Will continue supplements as needed.   Relevant Orders   CMP14+EGFR (Completed)   Vitamin B12 (Completed)   CBC with Diff (Completed)     Other fatigue       Relevant Orders   CMP14+EGFR (Completed)   TSH (Completed)   CBC with Diff (Completed)   Iron, TIBC and Ferritin Panel (Completed)     Encounter for blood typing       Checking labs today Will contact with results when available.   Relevant Orders   ABO AND RH  (Completed)     Esophageal dysphagia       Relevant Orders   Allergen food profile specific IgE (16109) (Completed)     Anomic dysphasia         Paresthesias       Setting up referral to second neurologist as patient requested second opinion. Contact information given to patient so she can call them       Return in about  2 weeks (around 11/12/2023) for F/U.   Total time spent: 30 minutes  Miki Kins, FNP  10/29/2023   This document may have been prepared by [facility] Voice Recognition software and as such may include unintentional dictation errors.

## 2023-12-30 ENCOUNTER — Encounter: Payer: Self-pay | Admitting: Family

## 2023-12-30 ENCOUNTER — Ambulatory Visit (INDEPENDENT_AMBULATORY_CARE_PROVIDER_SITE_OTHER): Payer: 59 | Admitting: Family

## 2023-12-30 VITALS — BP 154/88 | HR 67 | Ht 67.0 in | Wt 174.2 lb

## 2023-12-30 DIAGNOSIS — J439 Emphysema, unspecified: Secondary | ICD-10-CM | POA: Diagnosis not present

## 2023-12-30 DIAGNOSIS — G379 Demyelinating disease of central nervous system, unspecified: Secondary | ICD-10-CM | POA: Diagnosis not present

## 2023-12-30 DIAGNOSIS — F331 Major depressive disorder, recurrent, moderate: Secondary | ICD-10-CM

## 2023-12-30 DIAGNOSIS — L853 Xerosis cutis: Secondary | ICD-10-CM | POA: Diagnosis not present

## 2023-12-30 DIAGNOSIS — I10 Essential (primary) hypertension: Secondary | ICD-10-CM

## 2023-12-30 DIAGNOSIS — G9589 Other specified diseases of spinal cord: Secondary | ICD-10-CM | POA: Diagnosis not present

## 2023-12-30 DIAGNOSIS — R7303 Prediabetes: Secondary | ICD-10-CM

## 2023-12-30 NOTE — Patient Instructions (Addendum)
 [facility] - Neurology [address] [place], [place] [address]  Office: [phone]  [facility] [address] [place], [place] [address]  Office: [phone]

## 2024-01-19 ENCOUNTER — Encounter: Payer: Self-pay | Admitting: Family

## 2024-01-19 DIAGNOSIS — G379 Demyelinating disease of central nervous system, unspecified: Secondary | ICD-10-CM | POA: Insufficient documentation

## 2024-01-19 NOTE — Progress Notes (Signed)
 Established Patient Office Visit  Subjective:  Patient ID: Diane Knapp, female    DOB: May 07, 1966  Age: 58 y.o. MRN: [id number]  Chief Complaint  Patient presents with   Follow-up    1 month follow up    Patient is here today for her 1 month follow up.  She has been feeling fairly well since last appointment.   She does have additional concerns to discuss today.  She is feeling better since she got the lyrica and started it, but she also reports that she is still having some significant pain.  Her new concern today is that she has been having severely dry skin. She has tried multiple over the counter lotions, creams, and ointments to try to get her skin taken care of, but she has not been able to do this no matter what she has tried.  Labs are not due today. She needs refills.   I have reviewed her active problem list, medication list, allergies, notes from last encounter, lab results for her appointment today.      No other concerns at this time.   Past Medical History:  Diagnosis Date   Anxiety    Callus of foot    right bottom of foot- removed 11/24/15   Chronic cervical radicular pain (Right) 10/12/2015   COPD (chronic obstructive pulmonary disease) (HCC)    Depression    Encounter for long-term (current) use of medications 08/31/2015   Encounter for therapeutic drug level monitoring 11/24/2015   Hypertension    Hypomagnesemia 10/27/2015   Marijuana use (see 11/24/2015 UDS) 08/31/2015   Myelomalacia (HCC)    Noncompliance 08/31/2015   Patient previously discharged from [facility] clinic secondary to missing appointments. 10/12/2015 - patient is taking more medication than prescribed. 12/22/2015 - opioid analgesic pain management treatment option terminated today due to noncompliance with medication policy and signed medication agreement. Patient tested positive for cannabinoids on the 11/24/2015 UDS. [facility]   Noncompliance with medication  treatment due to overuse of medication 10/12/2015   Spondylosis    Substance use disorder Risk: HIGH 08/31/2015   Previously found to have a UDS positive for unreported use of ETOH and cannabinoids on 12/07/2013.       Past Surgical History:  Procedure Laterality Date   RIGHT OOPHORECTOMY     SPINE SURGERY      Social History   Socioeconomic History   Marital status: Single    Spouse name: Not on file   Number of children: Not on file   Years of education: Not on file   Highest education level: Not on file  Occupational History   Not on file  Tobacco Use   Smoking status: Every Day    Current packs/day: 1.00    Types: Cigarettes   Smokeless tobacco: Not on file  Substance and Sexual Activity   Alcohol use: Yes    Alcohol/week: 1.0 standard drink of alcohol    Types: 1 Shots of liquor per week    Comment: occasionally   Drug use: No   Sexual activity: Not on file  Other Topics Concern   Not on file  Social History Narrative   Left handed   Caffeine 6-8 cups daily   Homeless, lives in her car   Social Drivers of Health   Financial Resource Strain: Not on file  Food Insecurity: Not on file  Transportation Needs: Not on file  Physical Activity: Not on file  Stress: Not on file  Social Connections: Not on file  Intimate Partner Violence: Not on file    Family History  Problem Relation Age of Onset   Diabetes Mother    Cancer Mother    Stroke Mother    Hypertension Mother     Allergies  Allergen Reactions   Duloxetine Other (See Comments)   Cymbalta [Duloxetine Hcl] Other (See Comments)    hallucinations    Review of Systems  Skin:  Positive for itching.  Neurological:  Positive for speech change, focal weakness and weakness.  All other systems reviewed and are negative.      Objective:   BP (!) 154/88   Pulse 67   Ht 5\' 7"  (1.702 m)   Wt 174 lb 3.2 oz (79 kg)   SpO2 97%   BMI 27.28 kg/m   Vitals:   12/30/23 1114  BP: (!) 154/88   Pulse: 67  Height: 5\' 7"  (1.702 m)  Weight: 174 lb 3.2 oz (79 kg)  SpO2: 97%  BMI (Calculated): 27.28    Physical Exam Vitals and nursing note reviewed.  Constitutional:      Appearance: Normal appearance. She is normal weight.  HENT:     Head: Normocephalic.  Eyes:     Extraocular Movements: Extraocular movements intact.     Conjunctiva/sclera: Conjunctivae normal.     Pupils: Pupils are equal, round, and reactive to light.  Cardiovascular:     Rate and Rhythm: Normal rate.  Pulmonary:     Effort: Pulmonary effort is normal.  Musculoskeletal:        General: Normal range of motion.  Skin:    General: Skin is dry.  Neurological:     Mental Status: She is alert and oriented to person, place, and time.     Motor: Weakness and abnormal muscle tone present.     Coordination: Coordination abnormal.     Gait: Gait abnormal.     Deep Tendon Reflexes: Reflexes abnormal.  Psychiatric:        Attention and Perception: Attention normal.        Mood and Affect: Mood is anxious and depressed. Affect is tearful.        Speech: Speech normal.        Behavior: Behavior normal. Behavior is cooperative.        Thought Content: Thought content normal.        Cognition and Memory: Cognition and memory normal.        Judgment: Judgment normal.      No results found for any visits on 12/30/23.  Recent Results (from the past 2160 hours)  Lipid panel     Status: Abnormal   Collection Time: 10/29/23 12:13 PM  Result Value Ref Range   Cholesterol, Total 185 100 - 199 mg/dL   Triglycerides 161 0 - 149 mg/dL   HDL 59 >09 mg/dL   VLDL Cholesterol Cal 24 5 - 40 mg/dL   LDL Chol Calc (NIH) 604 (H) 0 - 99 mg/dL   Chol/HDL Ratio 3.1 0.0 - 4.4 ratio    Comment:                                   T. Chol/HDL Ratio  Men  Women                               1/2 Avg.Risk  3.4    3.3                                   Avg.Risk  5.0    4.4                                 2X Avg.Risk  9.6    7.1                                3X Avg.Risk 23.4   11.0   VITAMIN D 25 Hydroxy (Vit-D Deficiency, Fractures)     Status: Abnormal   Collection Time: 10/29/23 12:13 PM  Result Value Ref Range   Vit D, 25-Hydroxy 17.7 (L) 30.0 - 100.0 ng/mL    Comment: Vitamin D deficiency has been defined by the Institute of Medicine and an Endocrine Society practice guideline as a level of serum 25-OH vitamin D less than 20 ng/mL (1,2). The Endocrine Society went on to further define vitamin D insufficiency as a level between 21 and 29 ng/mL (2). 1. IOM (Institute of Medicine). 2010. Dietary reference    intakes for calcium and D. [place]: The    [employer]. 2. Holick MF, Binkley Claycomo, Bischoff-Ferrari HA, et al.    Evaluation, treatment, and prevention of vitamin D    deficiency: an Endocrine Society clinical practice    guideline. JCEM. 2011 Jul; 96(7):1911-30.   CMP14+EGFR     Status: Abnormal   Collection Time: 10/29/23 12:13 PM  Result Value Ref Range   Glucose 68 (L) 70 - 99 mg/dL   BUN 9 6 - 24 mg/dL   Creatinine, Ser 4.09 0.57 - 1.00 mg/dL   eGFR 74 >81 XB/JYN/8.29   BUN/Creatinine Ratio 10 9 - 23   Sodium 141 134 - 144 mmol/L   Potassium 4.3 3.5 - 5.2 mmol/L   Chloride 104 96 - 106 mmol/L   CO2 24 20 - 29 mmol/L   Calcium 9.4 8.7 - 10.2 mg/dL   Total Protein 7.1 6.0 - 8.5 g/dL   Albumin 4.6 3.8 - 4.9 g/dL   Globulin, Total 2.5 1.5 - 4.5 g/dL   Bilirubin Total 0.4 0.0 - 1.2 mg/dL   Alkaline Phosphatase 75 44 - 121 IU/L   AST 14 0 - 40 IU/L   ALT 12 0 - 32 IU/L  TSH     Status: None   Collection Time: 10/29/23 12:13 PM  Result Value Ref Range   TSH 0.620 0.450 - 4.500 uIU/mL  Hemoglobin A1c     Status: Abnormal   Collection Time: 10/29/23 12:13 PM  Result Value Ref Range   Hgb A1c MFr Bld 5.9 (H) 4.8 - 5.6 %    Comment:          Prediabetes: 5.7 - 6.4          Diabetes: >6.4          Glycemic control for adults with  diabetes: <7.0    Est. average glucose Bld gHb Est-mCnc 123 mg/dL  Vitamin F62     Status: None  Collection Time: 10/29/23 12:13 PM  Result Value Ref Range   Vitamin B-12 632 232 - 1,245 pg/mL  CBC with Diff     Status: Abnormal   Collection Time: 10/29/23 12:13 PM  Result Value Ref Range   WBC 8.0 3.4 - 10.8 x10E3/uL   RBC 4.55 3.77 - 5.28 x10E6/uL   Hemoglobin 13.7 11.1 - 15.9 g/dL   Hematocrit 21.3 08.6 - 46.6 %   MCV 90 79 - 97 fL   MCH 30.1 26.6 - 33.0 pg   MCHC 33.4 31.5 - 35.7 g/dL   RDW 57.8 46.9 - 62.9 %   Platelets 276 150 - 450 x10E3/uL   Neutrophils 47 Not Estab. %   Lymphs 42 Not Estab. %   Monocytes 8 Not Estab. %   Eos 2 Not Estab. %   Basos 1 Not Estab. %   Neutrophils Absolute 3.8 1.4 - 7.0 x10E3/uL   Lymphocytes Absolute 3.4 (H) 0.7 - 3.1 x10E3/uL   Monocytes Absolute 0.7 0.1 - 0.9 x10E3/uL   EOS (ABSOLUTE) 0.1 0.0 - 0.4 x10E3/uL   Basophils Absolute 0.1 0.0 - 0.2 x10E3/uL   Immature Granulocytes 0 Not Estab. %   Immature Grans (Abs) 0.0 0.0 - 0.1 x10E3/uL  Iron, TIBC and Ferritin Panel     Status: None   Collection Time: 10/29/23 12:13 PM  Result Value Ref Range   Total Iron Binding Capacity 402 250 - 450 ug/dL   UIBC 528 413 - 244 ug/dL   Iron 94 27 - 010 ug/dL   Iron Saturation 23 15 - 55 %   Ferritin 117 15 - 150 ng/mL  Allergen food profile specific IgE (27253)     Status: None   Collection Time: 10/29/23 12:13 PM  Result Value Ref Range   Class Description Allergens Comment     Comment:     Levels of Specific IgE       Class  Description of Class     ---------------------------  -----  --------------------                    < 0.10         0         Negative            0.10 -    0.31         0/I       Equivocal/Low            0.32 -    0.55         I         Low            0.56 -    1.40         II        Moderate            1.41 -    3.90         III       High            3.91 -   19.00         IV        Very High           19.01 -  100.00          V         Very High                   >  100.00         VI        Very High    IgE (Immunoglobulin E), Serum 69 6 - 495 IU/mL   Egg White IgE <0.10 Class 0 kU/L   Milk IgE <0.10 Class 0 kU/L   Codfish IgE <0.10 Class 0 kU/L   Wheat IgE <0.10 Class 0 kU/L   Allergen Corn, IgE <0.10 Class 0 kU/L   Peanut IgE <0.10 Class 0 kU/L   Soybean IgE <0.10 Class 0 kU/L   Shrimp IgE <0.10 Class 0 kU/L   Allergen Tomato, IgE <0.10 Class 0 kU/L   Orange <0.10 Class 0 kU/L   Tuna <0.10 Class 0 kU/L   Allergen Apple, IgE <0.10 Class 0 kU/L   Chicken IgE <0.10 Class 0 kU/L  ABO AND RH      Status: None   Collection Time: 10/29/23 12:13 PM  Result Value Ref Range   ABO Grouping O    Rh Factor Positive     Comment: Please note: Prior records for this patient's ABO / Rh type are not available for additional verification.        Assessment & Plan:   Problem List Items Addressed This Visit       Respiratory   Chronic obstructive pulmonary disease (COPD) (HCC)   Patient stable.  Well controlled with current therapy.   Continue current meds.          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  She is well controlled with current therapy.   Will defer to them for further changes to plan of care.       Demyelinating disease [facility])   Patient is seen by Neurology, who manage this condition.  She is well controlled with current therapy.   Will defer to them for further changes to plan of care.         Other   Major depressive disorder, recurrent episode, moderate (HCC)   Patient stable.  Well controlled with current therapy.   Continue current meds.        Other Visit Diagnoses       Dry skin dermatitis    -  Primary   Sending referral to dermatology for patient. Will defer to them for further treatment.   Relevant Orders   Ambulatory referral to Dermatology       Return  in about 2 months (around 02/29/2024) for F/U.   Total time spent: 30 minutes  Miki Kins, FNP  12/30/2023   This document may have been prepared by [facility] Voice Recognition software and as such may include unintentional dictation errors.

## 2024-01-19 NOTE — Assessment & Plan Note (Signed)
 Patient stable.  Well controlled with current therapy.   Continue current meds.

## 2024-01-19 NOTE — Assessment & Plan Note (Signed)
Patient is seen by Neurology, who manage this condition.  She is well controlled with current therapy.   Will defer to them for further changes to plan of care.

## 2024-01-21 ENCOUNTER — Telehealth: Payer: Self-pay | Admitting: Family

## 2024-01-21 NOTE — Telephone Encounter (Signed)
 Patient left VM needing her dermatology referral sent to [facility] because [facility] does not accept her insurance. She is also requesting a referral to neurology in [place] because she is unhappy with the care she has been getting at [facility]. Please advise.

## 2024-01-28 ENCOUNTER — Other Ambulatory Visit: Payer: Self-pay

## 2024-01-28 ENCOUNTER — Encounter: Payer: Self-pay | Admitting: Psychiatry

## 2024-01-28 ENCOUNTER — Ambulatory Visit (INDEPENDENT_AMBULATORY_CARE_PROVIDER_SITE_OTHER): Admitting: Psychiatry

## 2024-01-28 VITALS — BP 144/88 | HR 67 | Temp 97.5°F | Ht 67.0 in | Wt 169.6 lb

## 2024-01-28 DIAGNOSIS — F331 Major depressive disorder, recurrent, moderate: Secondary | ICD-10-CM

## 2024-01-28 DIAGNOSIS — L853 Xerosis cutis: Secondary | ICD-10-CM

## 2024-01-28 DIAGNOSIS — F17200 Nicotine dependence, unspecified, uncomplicated: Secondary | ICD-10-CM | POA: Insufficient documentation

## 2024-01-28 DIAGNOSIS — G9589 Other specified diseases of spinal cord: Secondary | ICD-10-CM

## 2024-01-28 DIAGNOSIS — G379 Demyelinating disease of central nervous system, unspecified: Secondary | ICD-10-CM

## 2024-01-28 DIAGNOSIS — F411 Generalized anxiety disorder: Secondary | ICD-10-CM | POA: Insufficient documentation

## 2024-01-28 DIAGNOSIS — F4310 Post-traumatic stress disorder, unspecified: Secondary | ICD-10-CM

## 2024-01-28 MED ORDER — BUPROPION HCL ER (SR) 100 MG PO TB12: 100.0000 mg | ORAL_TABLET | Freq: Every day | ORAL | Status: DC
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
## 2024-01-28 NOTE — Patient Instructions (Signed)
  [email or web address]  [email or web address]  [email or web address] [facility] [address], [place], [place] [address]   [phone]  [facility] [email or web address] [address], [place], [place] [address]  [phone]  [facility] [email or web address] [address], [place], [place] [address]  [phone]   [facility] solutions - [phone]  [facility] - [phone]  [facility] - [phone] counseling [phone]  [facility] [phone]   [email or web address] this clinician can offer telehealth and has a sliding scale option  [email or web address] this group also offers sliding scale rates and is based out of [place]  Dr. Liborio Nixon with the [facility] specializes in divorce  [employer] [facility] has interns who offer sliding scale rates and some of the full time clinicians do, as well. You complete their contact form on their website and the referrals coordinator will help to get connected to someone   [email or web address] [phone]  Medicaid below :  [facility] [address] [place], [place] [address]  [phone]    [employer] [address] [place], [place] [address]  [phone]    [facility] [address] [place], [place] [address]  [phone]

## 2024-02-02 ENCOUNTER — Encounter: Payer: Self-pay | Admitting: Family

## 2024-02-02 NOTE — Assessment & Plan Note (Signed)
 Continue Vitamin D supplements.  Will recheck labs at her follow up appt

## 2024-02-02 NOTE — Assessment & Plan Note (Signed)
 Patient stable.  Well controlled with current therapy.   Continue current meds.

## 2024-02-02 NOTE — Assessment & Plan Note (Signed)
Patient is seen by Neurology, who manage this condition.  She is well controlled with current therapy.   Will defer to them for further changes to plan of care.

## 2024-02-02 NOTE — Assessment & Plan Note (Signed)
 Patient is seen by Psychiatry, who manage this condition.  She is well controlled with current therapy.   Will defer to them for further changes to plan of care.

## 2024-03-02 ENCOUNTER — Ambulatory Visit: Admitting: Family

## 2024-03-03 ENCOUNTER — Other Ambulatory Visit: Payer: Self-pay | Admitting: Family

## 2024-03-03 ENCOUNTER — Other Ambulatory Visit: Payer: Self-pay | Admitting: Cardiovascular Disease

## 2024-03-03 DIAGNOSIS — R0789 Other chest pain: Secondary | ICD-10-CM

## 2024-03-03 DIAGNOSIS — R0602 Shortness of breath: Secondary | ICD-10-CM

## 2024-03-03 DIAGNOSIS — I10 Essential (primary) hypertension: Secondary | ICD-10-CM

## 2024-03-03 DIAGNOSIS — J439 Emphysema, unspecified: Secondary | ICD-10-CM

## 2024-03-03 DIAGNOSIS — R42 Dizziness and giddiness: Secondary | ICD-10-CM

## 2024-03-03 DIAGNOSIS — E782 Mixed hyperlipidemia: Secondary | ICD-10-CM

## 2024-03-04 ENCOUNTER — Ambulatory Visit (INDEPENDENT_AMBULATORY_CARE_PROVIDER_SITE_OTHER): Admitting: Family

## 2024-03-04 VITALS — BP 170/98 | HR 65 | Ht 67.0 in | Wt 170.0 lb

## 2024-03-04 DIAGNOSIS — G379 Demyelinating disease of central nervous system, unspecified: Secondary | ICD-10-CM | POA: Diagnosis not present

## 2024-03-04 DIAGNOSIS — I10 Essential (primary) hypertension: Secondary | ICD-10-CM | POA: Diagnosis not present

## 2024-03-04 DIAGNOSIS — J439 Emphysema, unspecified: Secondary | ICD-10-CM

## 2024-03-04 DIAGNOSIS — R079 Chest pain, unspecified: Secondary | ICD-10-CM

## 2024-03-04 MED ORDER — LOSARTAN POTASSIUM 50 MG PO TABS: 50.0000 mg | ORAL_TABLET | Freq: Every day | ORAL | 1 refills | Status: DC

## 2024-03-18 ENCOUNTER — Ambulatory Visit (INDEPENDENT_AMBULATORY_CARE_PROVIDER_SITE_OTHER): Admitting: Family

## 2024-03-18 DIAGNOSIS — R7303 Prediabetes: Secondary | ICD-10-CM

## 2024-03-18 DIAGNOSIS — E559 Vitamin D deficiency, unspecified: Secondary | ICD-10-CM

## 2024-03-18 DIAGNOSIS — F331 Major depressive disorder, recurrent, moderate: Secondary | ICD-10-CM

## 2024-03-18 DIAGNOSIS — E538 Deficiency of other specified B group vitamins: Secondary | ICD-10-CM | POA: Diagnosis not present

## 2024-03-18 DIAGNOSIS — E782 Mixed hyperlipidemia: Secondary | ICD-10-CM

## 2024-03-18 DIAGNOSIS — I10 Essential (primary) hypertension: Secondary | ICD-10-CM | POA: Diagnosis not present

## 2024-03-18 DIAGNOSIS — R5383 Other fatigue: Secondary | ICD-10-CM | POA: Diagnosis not present

## 2024-03-18 DIAGNOSIS — Z0183 Encounter for blood typing: Secondary | ICD-10-CM

## 2024-03-18 DIAGNOSIS — R1319 Other dysphagia: Secondary | ICD-10-CM

## 2024-03-18 MED ORDER — PROPRANOLOL HCL 20 MG PO TABS: 20.0000 mg | ORAL_TABLET | Freq: Three times a day (TID) | ORAL | 11 refills | Status: AC

## 2024-03-18 MED ORDER — PREGABALIN 100 MG PO CAPS: 100.0000 mg | ORAL_CAPSULE | Freq: Two times a day (BID) | ORAL | 2 refills | Status: DC

## 2024-03-18 NOTE — Progress Notes (Signed)
 Established Patient Office Visit  Subjective:  Patient ID: Diane Knapp, female    DOB: [birth year]  Age: 58 y.o. MRN: [id number]  Chief Complaint  Patient presents with   Follow-up    2 week follow up    Patient is here today for her 2 week follow up.  She is feeling slightly better.  Doing well with the Lyrica , asks if we can send refills.     No other concerns at this time.   Past Medical History:  Diagnosis Date   Anxiety    Callus of foot    right bottom of foot- removed 11/24/15   Chronic cervical radicular pain (Right) 10/12/2015   COPD (chronic obstructive pulmonary disease) (HCC)    Depression    Encounter for long-term (current) use of medications 08/31/2015   Encounter for therapeutic drug level monitoring 11/24/2015   Hypertension    Hypomagnesemia 10/27/2015   Marijuana use (see 11/24/2015 UDS) 08/31/2015   Myelomalacia (HCC)    Noncompliance 08/31/2015   Patient previously discharged from [facility] pain clinic secondary to missing appointments. 10/12/2015 - patient is taking more medication than prescribed. 12/22/2015 - opioid analgesic pain management treatment option terminated today due to noncompliance with medication policy and signed medication agreement. Patient tested positive for cannabinoids on the 11/24/2015 UDS. [facility]   Noncompliance with medication treatment due to overuse of medication 10/12/2015   Spondylosis    Substance use disorder Risk: HIGH 08/31/2015   Previously found to have a UDS positive for unreported use of ETOH and cannabinoids on 12/07/2013.       Past Surgical History:  Procedure Laterality Date   RIGHT OOPHORECTOMY     SPINE SURGERY      Social History   Socioeconomic History   Marital status: Legally Separated    Spouse name: Not on file   Number of children: 2   Years of education: Not on file   Highest education level: 11th grade  Occupational History   Not on file  Tobacco Use   Smoking  status: Every Day    Current packs/day: 0.50    Types: Cigarettes   Smokeless tobacco: Not on file  Vaping Use   Vaping status: Never Used  Substance and Sexual Activity   Alcohol use: Yes    Alcohol/week: 1.0 standard drink of alcohol    Types: 1 Shots of liquor per week    Comment: occasionally   Drug use: Not Currently    Types: Marijuana   Sexual activity: Not Currently  Other Topics Concern   Not on file  Social History Narrative   Left handed   Caffeine 6-8 cups daily   Homeless, lives in her car   Social Drivers of Corporate investment banker Strain: Not on file  Food Insecurity: Not on file  Transportation Needs: Not on file  Physical Activity: Not on file  Stress: Not on file  Social Connections: Not on file  Intimate Partner Violence: Not on file    Family History  Problem Relation Age of Onset   Depression Mother    Diabetes Mother    Cancer Mother    Stroke Mother    Hypertension Mother    Schizophrenia Sister    Depression Sister    Drug abuse Brother    Depression Brother    Drug abuse Maternal Uncle    Drug abuse Paternal Uncle    Drug abuse Cousin     Allergies  Allergen Reactions  Duloxetine Other (See Comments)   Lisinopril  Cough   Cymbalta [Duloxetine Hcl] Other (See Comments)    hallucinations    Review of Systems  All other systems reviewed and are negative.      Objective:   BP (!) 140/80   Pulse 84   Ht 5' 7 (1.702 m)   Wt 175 lb 12.8 oz (79.7 kg)   SpO2 98%   BMI 27.53 kg/m   Vitals:   03/18/24 1311  BP: (!) 140/80  Pulse: 84  Height: 5' 7 (1.702 m)  Weight: 175 lb 12.8 oz (79.7 kg)  SpO2: 98%  BMI (Calculated): 27.53    Physical Exam Vitals and nursing note reviewed.  Constitutional:      Appearance: Normal appearance. She is normal weight.  HENT:     Head: Normocephalic.  Eyes:     Extraocular Movements: Extraocular movements intact.     Conjunctiva/sclera: Conjunctivae normal.     Pupils: Pupils  are equal, round, and reactive to light.  Cardiovascular:     Rate and Rhythm: Normal rate and regular rhythm.  Pulmonary:     Effort: Pulmonary effort is normal.  Musculoskeletal:        General: Normal range of motion.     Cervical back: Normal range of motion.  Neurological:     General: No focal deficit present.     Mental Status: She is alert and oriented to person, place, and time. Mental status is at baseline.  Psychiatric:        Mood and Affect: Mood normal.        Behavior: Behavior normal.        Thought Content: Thought content normal.        Judgment: Judgment normal.      No results found for any visits on 03/18/24.  No results found for this or any previous visit (from the past 2160 hours).     Assessment & Plan Mixed hyperlipidemia Checking labs today.  Continue current therapy for lipid control. Will modify as needed based on labwork results.   -CMP w/eGFR -Lipid Panel  Essential hypertension, benign Blood pressure well controlled with current medications.  Continue current therapy.  Will reassess at follow up.   - CBC w/Diff - CMP w/eGFR  Prediabetes A1C Continues to be in prediabetic ranges.  Will reassess at follow up after next lab check.  Patient counseled on dietary choices and verbalized understanding.   -CBC w/Diff -CMP w/eGFR -Hemoglobin A1C  Vitamin D  deficiency, unspecified B12 deficiency due to diet Other fatigue Encounter for blood typing Checking labs today.  Will continue supplements as needed.   - Vitamin D  - Vitamin B12 - TSH  Major depressive disorder, recurrent episode, moderate (HCC) Patient stable.  Well controlled with current therapy.   Continue current meds.   Esophageal dysphagia Patient stable.  Well controlled with current therapy.   Continue current meds.       Return in about 1 month (around 04/18/2024) for F/U.   Total time spent: 20 minutes  ALAN CHRISTELLA ARRANT, FNP  03/18/2024   This  document may have been prepared by [facility] Voice Recognition software and as such may include unintentional dictation errors.

## 2024-03-18 NOTE — Patient Instructions (Addendum)
 [facility] at [facility]:  [address], [place], [place] [address] Appointment is on 05/08/2024 at 9:00 am.    [facility]:  Call to confirm your appointment.  Phone: [phone]

## 2024-03-19 ENCOUNTER — Ambulatory Visit: Payer: Self-pay

## 2024-03-19 LAB — CBC WITH DIFFERENTIAL/PLATELET
Basophils Absolute: 0.1 10*3/uL (ref 0.0–0.2)
Basos: 1 %
EOS (ABSOLUTE): 0.1 10*3/uL (ref 0.0–0.4)
Eos: 1 %
Hematocrit: 38.3 % (ref 34.0–46.6)
Hemoglobin: 12.3 g/dL (ref 11.1–15.9)
Immature Grans (Abs): 0 10*3/uL (ref 0.0–0.1)
Immature Granulocytes: 0 %
Lymphocytes Absolute: 3.1 10*3/uL (ref 0.7–3.1)
Lymphs: 36 %
MCH: 29.4 pg (ref 26.6–33.0)
MCHC: 32.1 g/dL (ref 31.5–35.7)
MCV: 91 fL (ref 79–97)
Monocytes Absolute: 0.6 10*3/uL (ref 0.1–0.9)
Monocytes: 7 %
Neutrophils Absolute: 4.8 10*3/uL (ref 1.4–7.0)
Neutrophils: 55 %
Platelets: 253 10*3/uL (ref 150–450)
RBC: 4.19 x10E6/uL (ref 3.77–5.28)
RDW: 13.6 % (ref 11.7–15.4)
WBC: 8.6 10*3/uL (ref 3.4–10.8)

## 2024-03-19 LAB — HEMOGLOBIN A1C
Est. average glucose Bld gHb Est-mCnc: 114 mg/dL
Hgb A1c MFr Bld: 5.6 % (ref 4.8–5.6)

## 2024-03-19 LAB — LIPID PANEL
Chol/HDL Ratio: 2.9 ratio (ref 0.0–4.4)
Cholesterol, Total: 170 mg/dL (ref 100–199)
HDL: 58 mg/dL (ref >39)
LDL Chol Calc (NIH): 94 mg/dL (ref 0–99)
Triglycerides: 98 mg/dL (ref 0–149)
VLDL Cholesterol Cal: 18 mg/dL (ref 5–40)

## 2024-03-19 LAB — CMP14+EGFR
ALT: 11 IU/L (ref 0–32)
AST: 17 IU/L (ref 0–40)
Albumin: 4.4 g/dL (ref 3.8–4.9)
Alkaline Phosphatase: 85 IU/L (ref 44–121)
BUN/Creatinine Ratio: 9 (ref 9–23)
BUN: 8 mg/dL (ref 6–24)
Bilirubin Total: <0.2 mg/dL (ref 0.0–1.2)
CO2: 23 mmol/L (ref 20–29)
Calcium: 9.2 mg/dL (ref 8.7–10.2)
Chloride: 107 mmol/L — ABNORMAL HIGH (ref 96–106)
Creatinine, Ser: 0.86 mg/dL (ref 0.57–1.00)
Globulin, Total: 2.3 g/dL (ref 1.5–4.5)
Glucose: 76 mg/dL (ref 70–99)
Potassium: 4.2 mmol/L (ref 3.5–5.2)
Sodium: 142 mmol/L (ref 134–144)
Total Protein: 6.7 g/dL (ref 6.0–8.5)
eGFR: 79 mL/min/{1.73_m2} (ref >59)

## 2024-03-19 LAB — VITAMIN B12: Vitamin B-12: 529 pg/mL (ref 232–1245)

## 2024-03-19 LAB — TSH: TSH: 0.647 u[IU]/mL (ref 0.450–4.500)

## 2024-03-19 LAB — VITAMIN D 25 HYDROXY (VIT D DEFICIENCY, FRACTURES): Vit D, 25-Hydroxy: 37.5 ng/mL (ref 30.0–100.0)

## 2024-03-31 ENCOUNTER — Encounter: Payer: Self-pay | Admitting: Psychiatry

## 2024-03-31 ENCOUNTER — Ambulatory Visit (INDEPENDENT_AMBULATORY_CARE_PROVIDER_SITE_OTHER): Admitting: Psychiatry

## 2024-03-31 VITALS — BP 138/84 | HR 78 | Temp 98.2°F | Ht 67.0 in | Wt 171.6 lb

## 2024-03-31 DIAGNOSIS — F4310 Post-traumatic stress disorder, unspecified: Secondary | ICD-10-CM

## 2024-03-31 DIAGNOSIS — F331 Major depressive disorder, recurrent, moderate: Secondary | ICD-10-CM | POA: Diagnosis not present

## 2024-03-31 DIAGNOSIS — F411 Generalized anxiety disorder: Secondary | ICD-10-CM

## 2024-03-31 DIAGNOSIS — F17200 Nicotine dependence, unspecified, uncomplicated: Secondary | ICD-10-CM

## 2024-03-31 MED ORDER — VARENICLINE TARTRATE 0.5 MG PO TABS: 0.5000 mg | ORAL_TABLET | Freq: Every day | ORAL | 1 refills | Status: DC

## 2024-03-31 MED ORDER — FLUOXETINE HCL 60 MG PO TABS
60.0000 mg | ORAL_TABLET | Freq: Every day | ORAL | 1 refills | Status: AC
Start: 2024-03-31 — End: ?

## 2024-03-31 NOTE — Progress Notes (Unsigned)
 [facility] MD OP Progress Note  03/31/2024 4:35 PM Diane Knapp  MRN:  [id number]  Chief Complaint:  Chief Complaint  Patient presents with   Follow-up   Anxiety   Depression   Medication Refill   Discussed the use of AI scribe software for clinical note transcription with the patient, who gave verbal consent to proceed.  History of Present Illness Diane Knapp is a 58 year old African-American female on disability, currently homeless, lives with a friend in [place], is separated, has a history of depression, PTSD, generalized anxiety disorder, was evaluated in office today for a follow-up appointment.  Her symptoms include ongoing anxiety, irritability, crying, and difficulty concentrating. Contributing factors to her mental health challenges include trauma, loss of a family member, and homelessness.  She is currently taking fluoxetine  40 mg), mirtazapine  15 mg and olanzapine 5 mg. She previously discontinued bupropion  due to adverse effects.  Married was initiated for smoking cessation by her primary provider.  Taking all her medications as prescribed results in excessive sedation, causing her to sleep for extended periods, sometimes up to two days. She did not take all her medications the night before the appointment to avoid this sedation.  She feels overwhelmed by her responsibilities, including caring for her incarcerated son, her sick spouse with whom she is separated, and her grandchildren. She lacks stable housing, currently living with a friend and often staying with her daughter, brother, or another friend. She is actively seeking her own place, aiming to secure housing by Thanksgiving or Christmas.  She has not yet established care with a therapist, citing a busy schedule and numerous personal responsibilities. She continues to smoke and has unsuccessfully tried nicotine  patches.  She is interested in trial of Chantix.  During the review of symptoms, she notes increased emotional  sensitivity, such as crying during TV shows. Her daughter observes changes in her mood when she takes her medications.  She is agreeable to dosage increase of fluoxetine  today to address her mood symptoms.  Denies side effects to the fluoxetine .  She denies any suicidality, homicidality or perceptual disturbances.   Visit Diagnosis:    ICD-10-CM   1. MDD (major depressive disorder), recurrent episode, moderate (HCC)  F33.1 FLUoxetine  HCl 60 MG TABS    2. PTSD (post-traumatic stress disorder)  F43.10 FLUoxetine  HCl 60 MG TABS    3. GAD (generalized anxiety disorder)  F41.1 FLUoxetine  HCl 60 MG TABS    4. Tobacco use disorder  F17.200 varenicline (CHANTIX) 0.5 MG tablet      Past Psychiatric History: I have reviewed past psychiatric history from progress note on 01/28/2024.  Past trials of medications like Wellbutrin , mirtazapine , Prozac , olanzapine and several others.  Past Medical History:  Past Medical History:  Diagnosis Date   Anxiety    Callus of foot    right bottom of foot- removed 11/24/15   Chronic cervical radicular pain (Right) 10/12/2015   COPD (chronic obstructive pulmonary disease) (HCC)    Depression    Encounter for long-term (current) use of medications 08/31/2015   Encounter for therapeutic drug level monitoring 11/24/2015   Hypertension    Hypomagnesemia 10/27/2015   Marijuana use (see 11/24/2015 UDS) 08/31/2015   Myelomalacia (HCC)    Noncompliance 08/31/2015   Patient previously discharged from [facility] pain clinic secondary to missing appointments. 10/12/2015 - patient is taking more medication than prescribed. 12/22/2015 - opioid analgesic pain management treatment option terminated today due to noncompliance with medication policy and signed medication agreement. Patient  tested positive for cannabinoids on the 11/24/2015 UDS. [facility]   Noncompliance with medication treatment due to overuse of medication 10/12/2015   Spondylosis    Substance  use disorder Risk: HIGH 08/31/2015   Previously found to have a UDS positive for unreported use of ETOH and cannabinoids on 12/07/2013.       Past Surgical History:  Procedure Laterality Date   RIGHT OOPHORECTOMY     SPINE SURGERY      Family Psychiatric History: I have reviewed family psychiatric history from progress note on 01/28/2024.  Family History:  Family History  Problem Relation Age of Onset   Depression Mother    Diabetes Mother    Cancer Mother    Stroke Mother    Hypertension Mother    Schizophrenia Sister    Depression Sister    Drug abuse Brother    Depression Brother    Drug abuse Maternal Uncle    Drug abuse Paternal Uncle    Drug abuse Cousin     Social History: I have reviewed social history from progress note on 01/28/2024. Social History   Socioeconomic History   Marital status: Legally Separated    Spouse name: Not on file   Number of children: 2   Years of education: Not on file   Highest education level: 11th grade  Occupational History   Not on file  Tobacco Use   Smoking status: Every Day    Current packs/day: 0.50    Types: Cigarettes   Smokeless tobacco: Not on file  Vaping Use   Vaping status: Never Used  Substance and Sexual Activity   Alcohol use: Yes    Alcohol/week: 1.0 standard drink of alcohol    Types: 1 Shots of liquor per week    Comment: occasionally   Drug use: Not Currently    Types: Marijuana   Sexual activity: Not Currently  Other Topics Concern   Not on file  Social History Narrative   Left handed   Caffeine 6-8 cups daily   Homeless, lives in her car   Social Drivers of Health   Financial Resource Strain: Not on file  Food Insecurity: Not on file  Transportation Needs: Not on file  Physical Activity: Not on file  Stress: Not on file  Social Connections: Not on file    Allergies:  Allergies  Allergen Reactions   Duloxetine Other (See Comments)   Lisinopril  Cough   Cymbalta [Duloxetine Hcl] Other (See  Comments)    hallucinations    Metabolic Disorder Labs: Lab Results  Component Value Date   HGBA1C 5.6 03/18/2024   No results found for: "PROLACTIN" Lab Results  Component Value Date   CHOL 170 03/18/2024   TRIG 98 03/18/2024   HDL 58 03/18/2024   CHOLHDL 2.9 03/18/2024   LDLCALC 94 03/18/2024   LDLCALC 102 (H) 10/29/2023   Lab Results  Component Value Date   TSH 0.647 03/18/2024   TSH 0.620 10/29/2023    Therapeutic Level Labs: No results found for: "LITHIUM" No results found for: "VALPROATE" No results found for: "CBMZ"  Current Medications: Current Outpatient Medications  Medication Sig Dispense Refill   amLODipine  (NORVASC ) 5 MG tablet Take 1 tablet (5 mg total) by mouth daily. 30 tablet 11   aspirin  EC 81 MG tablet Take 1 tablet (81 mg total) by mouth daily. Swallow whole. (Patient taking differently: Take 325 mg by mouth daily. Swallow whole.) 30 tablet 12   Cholecalciferol (VITAMIN D3) 2000 units  capsule Take 1 capsule (2,000 Units total) by mouth daily. 30 capsule PRN   FLUoxetine  HCl 60 MG TABS Take 60 mg by mouth daily with breakfast. Stop Fluoxetine  40 mg 30 tablet 1   isosorbide  mononitrate (IMDUR ) 30 MG 24 hr tablet Take 1 tablet by mouth once daily 30 tablet 0   losartan  (COZAAR ) 50 MG tablet Take 1 tablet (50 mg total) by mouth daily. 90 tablet 1   OLANZapine (ZYPREXA) 5 MG tablet Take 5 mg by mouth at bedtime.     pregabalin  (LYRICA ) 100 MG capsule Take 1 capsule (100 mg total) by mouth 2 (two) times daily. 60 capsule 2   propranolol  (INDERAL ) 20 MG tablet Take 1 tablet (20 mg total) by mouth 3 (three) times daily. 90 tablet 11   varenicline (CHANTIX) 0.5 MG tablet Take 1 tablet (0.5 mg total) by mouth daily. 30 tablet 1   Vitamin D , Ergocalciferol , (DRISDOL ) 1.25 MG (50000 UNIT) CAPS capsule Take 1 capsule (50,000 Units total) by mouth every 7 (seven) days. 12 capsule 3   No current facility-administered medications for this visit.      Musculoskeletal: Strength & Muscle Tone: Limited walks with a cane, Gait & Station: Walks with a cane, slow Patient leans: N/A  Psychiatric Specialty Exam: Review of Systems  Psychiatric/Behavioral:  Positive for dysphoric mood and sleep disturbance. The patient is nervous/anxious.     Blood pressure 138/84, pulse 78, temperature 98.2 F (36.8 C), temperature source Temporal, height 5\' 7"  (1.702 m), weight 171 lb 9.6 oz (77.8 kg), SpO2 100%.Body mass index is 26.88 kg/m.  General Appearance: Fairly Groomed  Eye Contact:  Fair  Speech:  Normal Rate  Volume:  Normal  Mood:  Anxious and Dysphoric  Affect:  Congruent  Thought Process:  Goal Directed and Descriptions of Associations: Intact  Orientation:  Full (Time, Place, and Person)  Thought Content: Logical   Suicidal Thoughts:  No  Homicidal Thoughts:  No  Memory:  Immediate;   Fair Recent;   Fair Remote;   limited  Judgement:  Fair  Insight:  Fair  Psychomotor Activity:  Normal  Concentration:  Concentration: Fair and Attention Span: Fair  Recall:  [employer] of Knowledge: Fair  Language: Fair  Akathisia:  No  Handed:  Left  AIMS (if indicated): done  Assets:  Communication Skills Desire for Improvement Housing  ADL's:  Intact  Cognition: WNL  Sleep:  Excessive   Screenings: AIMS    Flowsheet Row Office Visit from 01/28/2024 in [facility]  AIMS Total Score 0      GAD-7    Flowsheet Row Office Visit from 01/28/2024 in [facility] Office Visit from 11/18/2023 in [facility]  Total GAD-7 Score 11 16      PHQ2-9    Flowsheet Row Office Visit from 01/28/2024 in [facility] Office Visit from 11/18/2023 in [facility] Clinical Support from 12/22/2015 in [facility] Clinical Support from 11/24/2015 in  [facility] Clinical Support from 10/27/2015 in [facility] Total Score 3 5 0 0 0  PHQ-9 Total Score 13 16 -- -- --      [employer] Office Visit from 01/28/2024 in [facility] ED from 09/12/2021 in [facility] at [facility]  RISK CATEGORY Moderate Risk No Risk        Assessment and Plan: Diane Knapp is a 58 year old African-American female with history of depression, was evaluated in office today for a follow-up appointment.  Discussed assessment and plan as noted below.  Depression-unstable Chronic depression crying, concentration problems.  Currently believes her medications at bedtime as oversedating.  Agreeable to dosage increase of fluoxetine  during the day. Discontinue mirtazapine  due to side effects of oversedation Continue olanzapine 5 mg at bedtime for now. Increase fluoxetine  to 60 mg daily. Encouraged to establish care with therapist.   Anxiety-unstable Continues to have anxiety symptoms due to situational stressors.  Current medications with side effects of oversedation especially the bedtime medications like mirtazapine  and olanzapine.  Has not been able to establish care with therapist yet. Discontinue mirtazapine  Increase fluoxetine  to 60 mg daily Provided resources for therapist in the community-pending  PTSD-unstable History of significant trauma with flashbacks, nightmares.  Continue to have mood lability, crying spells. Referred for CBT-pending Referred for psychotherapy again. Increase fluoxetine  to 60 mg daily Continue olanzapine for now.  Tobacco use disorder-unstable Continues to smoke half pack per day.  Receptive to counseling.  Failed bupropion , developed side effects. Provided counseling for 5 minutes. Start Chantix 0.5 mg daily with plan to  gradually increase the dosage. Provided medication education.   Follow-up Follow-up in clinic in 1 month or sooner if needed.  Collaboration of Care: Collaboration of Care: Referral or follow-up with counselor/therapist AEB encouraged to establish care with therapist.  Patient/Guardian was advised Release of Information must be obtained prior to any record release in order to collaborate their care with an outside provider. Patient/Guardian was advised if they have not already done so to contact the registration department to sign all necessary forms in order for us  to release information regarding their care.   Consent: Patient/Guardian gives verbal consent for treatment and assignment of benefits for services provided during this visit. Patient/Guardian expressed understanding and agreed to proceed.   This note was generated in part or whole with voice recognition software. Voice recognition is usually quite accurate but there are transcription errors that can and very often do occur. I apologize for any typographical errors that were not detected and corrected.    Jeilani Grupe, MD 03/31/2024, 4:35 PM

## 2024-03-31 NOTE — Patient Instructions (Addendum)
 VISIT SUMMARY: Today, we discussed your ongoing mental health concerns, including major depression, PTSD, generalized anxiety, and bipolar disorder. We reviewed your current medications and made some adjustments to help manage your symptoms better. We also talked about your challenges with smoking cessation and provided some new options to help you quit smoking.  YOUR PLAN: -MAJOR DEPRESSION: Major depression is a mood disorder characterized by persistent feelings of sadness and loss of interest. To help manage your symptoms, we have increased your fluoxetine  to 60 mg daily in the morning and discontinued mirtazapine  due to excessive sedation. You should continue taking olanzapine and monitor for any sedation.  -GENERALIZED ANXIETY DISORDER: Generalized anxiety disorder involves excessive, uncontrollable worry about various aspects of life. We have increased your fluoxetine  to 60 mg daily in the morning to help with anxiety symptoms. It is important to establish care with a therapist to develop strategies for managing your anxiety.  -PTSD: PTSD is a mental health condition triggered by a traumatic event. We have increased your fluoxetine  to 60 mg daily in the morning to help with PTSD symptoms. It is important to establish care with a therapist to develop strategies for managing your PTSD.  -SMOKING CESSATION: We discussed your ongoing efforts to quit smoking. We have started you on Chantix 0.5 mg daily and will monitor for any side effects. Additionally, we provided information on nicotine  replacement therapies such as gums, lozenges, and patches.   INSTRUCTIONS: Please follow up with us  to monitor your response to the medication changes. Establish care with a therapist as soon as possible to help manage your anxiety and PTSD. We will also check your insurance coverage for Chantix and provide further guidance on smoking cessation.   Varenicline Tablets What is this medication? VARENICLINE (var e  NI kleen) helps you quit smoking. It reduces cravings for nicotine , the addictive substance found in tobacco. It is most effective when used in combination with a stop-smoking program. This medicine may be used for other purposes; ask your health care provider or pharmacist if you have questions. COMMON BRAND NAME(S): Chantix What should I tell my care team before I take this medication? They need to know if you have any of these conditions: Heart disease Frequently drink alcohol Kidney disease Mental health condition On hemodialysis Seizures History of stroke Suicidal thoughts, plans, or attempt by you or a family member An unusual or allergic reaction to varenicline, other medications, foods, dyes, or preservatives Pregnant or trying to get pregnant Breast-feeding How should I use this medication? Take this medication by mouth after eating. Take with a full glass of water. Follow the directions on the prescription label. Take your doses at regular intervals. Do not take your medication more often than directed. There are 3 ways you can use this medication to help you quit smoking; talk to your care team to decide which plan is right for you: 1) you can choose a quit date and start this medication 1 week before the quit date, or, 2) you can start taking this medication before you choose a quit date, and then pick a quit date between day 8 and 35 days of treatment, or, 3) if you are not sure that you are able or willing to quit smoking right away, start taking this medication and slowly decrease the amount you smoke as directed by your care team with the goal of being cigarette-free by week 12 of treatment. Stick to your plan; ask about support groups or other ways to help you  remain cigarette-free. If you are motivated to quit smoking and did not succeed during a previous attempt with this medication for reasons other than side effects, or if you returned to smoking after this treatment, speak  with your care team about whether another course of this medication may be right for you. A special MedGuide will be given to you by the pharmacist with each prescription and refill. Be sure to read this information carefully each time. Talk to your care team about the use of this medication in children. This medication is not approved for use in children. Overdosage: If you think you have taken too much of this medicine contact a poison control center or emergency room at once. NOTE: This medicine is only for you. Do not share this medicine with others. What if I miss a dose? If you miss a dose, take it as soon as you can. If it is almost time for your next dose, take only that dose. Do not take double or extra doses. What may interact with this medication? Alcohol Insulin Other medications used to help people quit smoking Theophylline Warfarin This list may not describe all possible interactions. Give your health care provider a list of all the medicines, herbs, non-prescription drugs, or dietary supplements you use. Also tell them if you smoke, drink alcohol, or use illegal drugs. Some items may interact with your medicine. What should I watch for while using this medication? It is okay if you do not succeed at your attempt to quit and have a cigarette. You can still continue your quit attempt and keep using this medication as directed. Just throw away your cigarettes and get back to your quit plan. Talk to your care team before using other treatments to quit smoking. Using this medication with other treatments to quit smoking may increase the risk for side effects compared to using a treatment alone. This medication may affect your coordination, reaction time, or judgment. Do not drive or operate machinery until you know how this medication affects you. Sit up or stand slowly to reduce the risk of dizzy or fainting spells. Decrease the number of alcoholic beverages that you drink during  treatment with this medication until you know if this medication affects your ability to tolerate alcohol. Some people have experienced increased drunkenness (intoxication), unusual or sometimes aggressive behavior, or no memory of things that have happened (amnesia) during treatment with this medication. You may do unusual sleep behaviors or activities you do not remember the day after taking this medication. Activities include driving, making or eating food, talking on the phone, sexual activity, or sleep walking. Stop taking this medication and call your care team right away if you find out you have done activities like this. Patients and their families should watch out for new or worsening depression or thoughts of suicide. Also watch out for sudden changes in feelings such as feeling anxious, agitated, panicky, irritable, hostile, aggressive, impulsive, severely restless, overly excited and hyperactive, or not being able to sleep. If this happens, call your care team. If you have diabetes, and you quit smoking, the effects of insulin may be increased. You may need to reduce your insulin dose. Check with your care team about how you should adjust your insulin dose. What side effects may I notice from receiving this medication? Side effects that you should report to your care team as soon as possible: Allergic reactions or angioedema--skin rash, itching or hives, swelling of the face, eyes, lips, tongue, arms, or  legs, trouble swallowing or breathing Heart attack--pain or tightness in the chest, shoulders, arms, or jaw, nausea, shortness of breath, cold or clammy skin, feeling faint or lightheaded Mood and behavior changes--anxiety, nervousness, confusion, hallucinations, irritability, hostility, thoughts of suicide or self-harm, worsening mood, feelings of depression Redness, blistering, peeling, or loosening of the skin, including inside the mouth Stroke--sudden numbness or weakness of the face, arm,  or leg, trouble speaking, confusion, trouble walking, loss of balance or coordination, dizziness, severe headache, change in vision Seizures Side effects that usually do not require medical attention (report to your care team if they continue or are bothersome): Constipation Drowsiness Gas Nausea Trouble sleeping Upset stomach Vivid dreams or nightmares Vomiting This list may not describe all possible side effects. Call your doctor for medical advice about side effects. You may report side effects to FDA at 1-800-FDA-1088. Where should I keep my medication? Keep out of the reach of children and pets. Store at room temperature between 15 and 30 degrees C (59 and 86 degrees F). Throw away any unused medication after the expiration date. NOTE: This sheet is a summary. It may not cover all possible information. If you have questions about this medicine, talk to your doctor, pharmacist, or health care provider.  2024 Elsevier/Gold Standard (2021-08-31 00:00:00)                     Contains text generated by [employer].                                 Contains text generated by [employer].    Varenicline Tablets What is this medication? VARENICLINE (var e NI kleen) helps you quit smoking. It reduces cravings for nicotine , the addictive substance found in tobacco. It is most effective when used in combination with a stop-smoking program. This medicine may be used for other purposes; ask your health care provider or pharmacist if you have questions. COMMON BRAND NAME(S): Chantix What should I tell my care team before I take this medication? They need to know if you have any of these conditions: Heart disease Frequently drink alcohol Kidney disease Mental health condition On hemodialysis Seizures History of stroke Suicidal thoughts, plans, or attempt by you or a family member An unusual or allergic reaction to varenicline, other medications,  foods, dyes, or preservatives Pregnant or trying to get pregnant Breast-feeding How should I use this medication? Take this medication by mouth after eating. Take with a full glass of water. Follow the directions on the prescription label. Take your doses at regular intervals. Do not take your medication more often than directed. There are 3 ways you can use this medication to help you quit smoking; talk to your care team to decide which plan is right for you: 1) you can choose a quit date and start this medication 1 week before the quit date, or, 2) you can start taking this medication before you choose a quit date, and then pick a quit date between day 8 and 35 days of treatment, or, 3) if you are not sure that you are able or willing to quit smoking right away, start taking this medication and slowly decrease the amount you smoke as directed by your care team with the goal of being cigarette-free by week 12 of treatment. Stick to your plan; ask about support groups or other ways to help you remain cigarette-free. If you are motivated to quit  smoking and did not succeed during a previous attempt with this medication for reasons other than side effects, or if you returned to smoking after this treatment, speak with your care team about whether another course of this medication may be right for you. A special MedGuide will be given to you by the pharmacist with each prescription and refill. Be sure to read this information carefully each time. Talk to your care team about the use of this medication in children. This medication is not approved for use in children. Overdosage: If you think you have taken too much of this medicine contact a poison control center or emergency room at once. NOTE: This medicine is only for you. Do not share this medicine with others. What if I miss a dose? If you miss a dose, take it as soon as you can. If it is almost time for your next dose, take only that dose. Do not  take double or extra doses. What may interact with this medication? Alcohol Insulin Other medications used to help people quit smoking Theophylline Warfarin This list may not describe all possible interactions. Give your health care provider a list of all the medicines, herbs, non-prescription drugs, or dietary supplements you use. Also tell them if you smoke, drink alcohol, or use illegal drugs. Some items may interact with your medicine. What should I watch for while using this medication? It is okay if you do not succeed at your attempt to quit and have a cigarette. You can still continue your quit attempt and keep using this medication as directed. Just throw away your cigarettes and get back to your quit plan. Talk to your care team before using other treatments to quit smoking. Using this medication with other treatments to quit smoking may increase the risk for side effects compared to using a treatment alone. This medication may affect your coordination, reaction time, or judgment. Do not drive or operate machinery until you know how this medication affects you. Sit up or stand slowly to reduce the risk of dizzy or fainting spells. Decrease the number of alcoholic beverages that you drink during treatment with this medication until you know if this medication affects your ability to tolerate alcohol. Some people have experienced increased drunkenness (intoxication), unusual or sometimes aggressive behavior, or no memory of things that have happened (amnesia) during treatment with this medication. You may do unusual sleep behaviors or activities you do not remember the day after taking this medication. Activities include driving, making or eating food, talking on the phone, sexual activity, or sleep walking. Stop taking this medication and call your care team right away if you find out you have done activities like this. Patients and their families should watch out for new or worsening  depression or thoughts of suicide. Also watch out for sudden changes in feelings such as feeling anxious, agitated, panicky, irritable, hostile, aggressive, impulsive, severely restless, overly excited and hyperactive, or not being able to sleep. If this happens, call your care team. If you have diabetes, and you quit smoking, the effects of insulin may be increased. You may need to reduce your insulin dose. Check with your care team about how you should adjust your insulin dose. What side effects may I notice from receiving this medication? Side effects that you should report to your care team as soon as possible: Allergic reactions or angioedema--skin rash, itching or hives, swelling of the face, eyes, lips, tongue, arms, or legs, trouble swallowing or breathing Heart attack--pain or  tightness in the chest, shoulders, arms, or jaw, nausea, shortness of breath, cold or clammy skin, feeling faint or lightheaded Mood and behavior changes--anxiety, nervousness, confusion, hallucinations, irritability, hostility, thoughts of suicide or self-harm, worsening mood, feelings of depression Redness, blistering, peeling, or loosening of the skin, including inside the mouth Stroke--sudden numbness or weakness of the face, arm, or leg, trouble speaking, confusion, trouble walking, loss of balance or coordination, dizziness, severe headache, change in vision Seizures Side effects that usually do not require medical attention (report to your care team if they continue or are bothersome): Constipation Drowsiness Gas Nausea Trouble sleeping Upset stomach Vivid dreams or nightmares Vomiting This list may not describe all possible side effects. Call your doctor for medical advice about side effects. You may report side effects to FDA at 1-800-FDA-1088. Where should I keep my medication? Keep out of the reach of children and pets. Store at room temperature between 15 and 30 degrees C (59 and 86 degrees F). Throw  away any unused medication after the expiration date. NOTE: This sheet is a summary. It may not cover all possible information. If you have questions about this medicine, talk to your doctor, pharmacist, or health care provider.  2024 Elsevier/Gold Standard (2021-08-31 00:00:00)

## 2024-04-01 ENCOUNTER — Ambulatory Visit: Payer: Medicare HMO | Admitting: Neurology

## 2024-04-15 ENCOUNTER — Encounter: Payer: Self-pay | Admitting: Family

## 2024-04-15 ENCOUNTER — Ambulatory Visit (INDEPENDENT_AMBULATORY_CARE_PROVIDER_SITE_OTHER): Admitting: Family

## 2024-04-15 VITALS — BP 130/78 | HR 57 | Ht 67.0 in | Wt 165.6 lb

## 2024-04-15 DIAGNOSIS — G379 Demyelinating disease of central nervous system, unspecified: Secondary | ICD-10-CM | POA: Diagnosis not present

## 2024-04-15 DIAGNOSIS — R7303 Prediabetes: Secondary | ICD-10-CM

## 2024-04-15 DIAGNOSIS — G9589 Other specified diseases of spinal cord: Secondary | ICD-10-CM

## 2024-04-15 DIAGNOSIS — J439 Emphysema, unspecified: Secondary | ICD-10-CM | POA: Diagnosis not present

## 2024-04-15 DIAGNOSIS — M792 Neuralgia and neuritis, unspecified: Secondary | ICD-10-CM

## 2024-04-15 DIAGNOSIS — Z0130 Encounter for examination of blood pressure without abnormal findings: Secondary | ICD-10-CM

## 2024-04-15 DIAGNOSIS — G8929 Other chronic pain: Secondary | ICD-10-CM | POA: Diagnosis not present

## 2024-04-15 MED ORDER — PREDNISONE 20 MG PO TABS: 40.0000 mg | ORAL_TABLET | Freq: Every day | ORAL | 0 refills | Status: DC

## 2024-04-16 ENCOUNTER — Telehealth: Payer: Self-pay | Admitting: *Deleted

## 2024-04-16 ENCOUNTER — Telehealth: Payer: Self-pay

## 2024-04-16 NOTE — Progress Notes (Signed)
 Complex Care Management Note Care Guide Note  04/16/2024 Name: Diane Knapp MRN: [id number] DOB: Aug 20, 1966   Complex Care Management Outreach Attempts: An unsuccessful telephone outreach was attempted today to offer the patient information about available complex care management services.  Follow Up Plan:  Additional outreach attempts will be made to offer the patient complex care management information and services.   Encounter Outcome:  No Answer  Asencion Randee [facility] HealthPopulation Health Care Guide  Direct Dial:[phone] [phone] Website: [email or web address]

## 2024-04-16 NOTE — Progress Notes (Signed)
 Complex Care Management Note  Care Guide Note 04/16/2024 Name: Diane Knapp MRN: [id number] DOB: August 30, 1966  Diane Knapp is a 58 y.o. year old female who sees [name], FNP for primary care. I reached out to [name] by phone today to offer complex care management services.  Ms. Gernert was given information about Complex Care Management services today including:   The Complex Care Management services include support from the care team which includes your Nurse Care Manager, Clinical Social Worker, or Pharmacist.  The Complex Care Management team is here to help remove barriers to the health concerns and goals most important to you. Complex Care Management services are voluntary, and the patient may decline or stop services at any time by request to their care team member.   Complex Care Management Consent Status: Patient agreed to services and verbal consent obtained.   Follow up plan:  Telephone appointment with complex care management team member scheduled for:  04-17-24 [facility] and 04-27-24 with LCSW.   Encounter Outcome:  Patient Scheduled  [name] [facility] Guide  Direct Dial: [phone]  Fax [phone]

## 2024-04-17 ENCOUNTER — Telehealth: Payer: Self-pay | Admitting: *Deleted

## 2024-04-17 ENCOUNTER — Encounter: Payer: Self-pay | Admitting: *Deleted

## 2024-04-17 ENCOUNTER — Other Ambulatory Visit: Payer: Self-pay | Admitting: *Deleted

## 2024-04-17 NOTE — Patient Outreach (Signed)
 Encounter opened in error Unsuccessful outreach to patient  Suzen L. Ramonita, RN, BSN, CCM [place]  [facility], [facility] RN Care Manager Direct Dial: [phone]  Fax: [phone]

## 2024-04-17 NOTE — Progress Notes (Signed)
 Complex Care Management Note Care Guide Note  04/17/2024 Name: Diane Knapp MRN: [id number] DOB: 15-Jan-1966   Complex Care Management Outreach Attempts: A second unsuccessful outreach was attempted today to offer the patient with information about available complex care management services.  Follow Up Plan:  Additional outreach attempts will be made to offer the patient complex care management information and services.   Encounter Outcome:  No Answer  Asencion Randee [facility] HealthPopulation Health Care Guide  Direct Dial:[phone] Fax:[phone] Website: [email or web address]

## 2024-04-20 ENCOUNTER — Telehealth: Payer: Self-pay | Admitting: *Deleted

## 2024-04-20 NOTE — Progress Notes (Signed)
 Complex Care Management Note Care Guide Note  04/20/2024 Name: Diane Knapp MRN: [id number] DOB: Apr 06, 1966   Complex Care Management Outreach Attempts: A third unsuccessful outreach was attempted today to offer the patient with information about available complex care management services.  Follow Up Plan:  No further outreach attempts will be made at this time. We have been unable to contact the patient to offer or enroll patient in complex care management services.  Encounter Outcome:  Patient Visit Completed  [name]  [facility] HealthPopulation Health Care Guide  Direct Dial:[phone] Fax:[phone] Website: [email or web address]

## 2024-04-21 ENCOUNTER — Telehealth: Payer: Self-pay

## 2024-04-21 ENCOUNTER — Encounter: Payer: Self-pay | Admitting: Neurology

## 2024-04-21 ENCOUNTER — Ambulatory Visit: Admitting: Neurology

## 2024-04-21 NOTE — Progress Notes (Unsigned)
 Complex Care Management Care Guide Note  04/21/2024 Name: Diane Knapp MRN: [id number] DOB: 1965-12-30  Diane Knapp is a 58 y.o. year old female who is a primary care patient of [name], FNP and is actively engaged with the care management team. I reached out to [name] by phone today to assist with re-scheduling  with the RN Case Manager.  Follow up plan: Unsuccessful telephone outreach attempt made. A HIPAA compliant phone message was left for the patient providing contact information and requesting a return call.  [name] [facility], [facility] Guide  Direct Dial: [phone]  Fax [phone]

## 2024-04-22 NOTE — Progress Notes (Signed)
 Complex Care Management Care Guide Note  04/22/2024 Name: Diane Knapp MRN: [id number] DOB: [birth year]  Diane Knapp is a 58 y.o. year old female who is a primary care patient of [name], FNP and is actively engaged with the care management team. I reached out to [name] by phone today to assist with re-scheduling  with the RN Case Manager.  Follow up plan: Unsuccessful telephone outreach attempt made. A HIPAA compliant phone message was left for the patient providing contact information and requesting a return call.  [name] [facility], [facility] Guide  Direct Dial: [phone]  Fax [phone]

## 2024-04-27 ENCOUNTER — Telehealth: Payer: Self-pay

## 2024-04-27 NOTE — Patient Outreach (Signed)
 LCSW attempted to reach patient at scheduled and was unable to reach patient. LCSW could not leave voicemail due to it being full. LCSW send message through my chart as well.  Olam Ally, MSW, LCSW [place]  [facility], [facility] Licensed Clinical Social Worker Direct Dial: [phone]

## 2024-04-27 NOTE — Patient Outreach (Signed)
 LCSW received a return call from patient. LCSW explained that LCSW had called at 10:00 am today for scheduled appointment. Patient explained that she was sleep. Patient is willing to reschedule and requested a late appointment. LCSW informed patient that other members of the team have been trying to reach her as well. LCSW provided care guide number [place]andy and asked patient to follow up. Patient was rescheduled for 05/04/2024 at 3:00 PM.  Olam Ally, MSW, LCSW [place]  [facility], [facility] Licensed Clinical Social Worker Direct Dial: [phone]

## 2024-05-04 ENCOUNTER — Other Ambulatory Visit: Payer: Self-pay

## 2024-05-04 DIAGNOSIS — F329 Major depressive disorder, single episode, unspecified: Secondary | ICD-10-CM

## 2024-05-04 NOTE — Patient Outreach (Signed)
 Complex Care Management   Visit Note  05/04/2024  Name:  Diane Knapp MRN: [id number] DOB: Jul 26, 1966  Situation: Referral received for Complex Care Management related to Mental/Behavioral Health diagnosis anxiety and depression and SDOH Barriers:  Housing  and Food insecurity. I obtained verbal consent from Patient.  Visit completed with patient  on the phone. LCSW spoke with patient for scheduled visit. Patient explained that her phone was not working properly therefore she has missed calls. Patient explains that she is currently living with a friend however needs stable housing. Patient explained that she has a medication provider for mental health diagnosis however is need of a therapist that provides in person and virtual therapy. Background:   Past Medical History:  Diagnosis Date   Anxiety    Callus of foot    right bottom of foot- removed 11/24/15   Chronic cervical radicular pain (Right) 10/12/2015   COPD (chronic obstructive pulmonary disease) (HCC)    Depression    Encounter for long-term (current) use of medications 08/31/2015   Encounter for therapeutic drug level monitoring 11/24/2015   Hypertension    Hypomagnesemia 10/27/2015   Marijuana use (see 11/24/2015 UDS) 08/31/2015   Myelomalacia (HCC)    Noncompliance 08/31/2015   Patient previously discharged from [facility] pain clinic secondary to missing appointments. 10/12/2015 - patient is taking more medication than prescribed. 12/22/2015 - opioid analgesic pain management treatment option terminated today due to noncompliance with medication policy and signed medication agreement. Patient tested positive for cannabinoids on the 11/24/2015 UDS. [facility]   Noncompliance with medication treatment due to overuse of medication 10/12/2015   Spondylosis    Substance use disorder Risk: HIGH 08/31/2015   Previously found to have a UDS positive for unreported use of ETOH and cannabinoids on 12/07/2013.        Assessment: Patient Reported Symptoms:  Cognitive Cognitive Status: Able to follow simple commands, Normal speech and language skills      Neurological Neurological Review of Symptoms: Not assessed    HEENT HEENT Symptoms Reported: Not assessed      Cardiovascular Cardiovascular Symptoms Reported: Not assessed    Respiratory Respiratory Symptoms Reported: Not assesed    Endocrine Endocrine Symptoms Reported: Not assessed    Gastrointestinal Gastrointestinal Symptoms Reported: Not assessed      Genitourinary Genitourinary Symptoms Reported: Not assessed    Integumentary Integumentary Symptoms Reported: Not assessed    Musculoskeletal Musculoskelatal Symptoms Reviewed: Not assessed        Psychosocial Psychosocial Symptoms Reported: Anxiety - if selected complete GAD, Depression - if selected complete PHQ 2-9, Difficulty concentrating Behavioral Management Strategies: Medication therapy, Coping strategies (patient is amenable to therapy, patient reports that riding assist when she becomes stressed) Behavioral Health Self-Management Outcome: 3 (uncertain) Major Change/Loss/Stressor/Fears (CP): Environment, Resources, Medical condition, self (Patient does not have stable housing) Behaviors When Feeling Stressed/Fearful: patient reports wanting to be isolated. Techniques to Cope with Loss/Stress/Change: Meditation (Patient is amenable to therapy) Quality of Family Relationships: supportive, stressful Do you feel physically threatened by others?: No      05/04/2024    3:16 PM  Depression screen PHQ 2/9  Decreased Interest 3  Down, Depressed, Hopeless 3  PHQ - 2 Score 6  Altered sleeping 3  Tired, decreased energy 3  Change in appetite 2  Feeling bad or failure about yourself  3  Trouble concentrating 2  Moving slowly or fidgety/restless 0  Suicidal thoughts 0  PHQ-9 Score 19  Difficult doing work/chores Very  difficult    There were no vitals filed for this  visit.  Medications Reviewed Today   Medications were not reviewed in this encounter     Recommendation:   Continue Current Plan of Care  Follow Up Plan:   Telephone follow-up 05/21/2024 at 2:00 PM  Olam Ally, MSW, LCSW   [facility], [facility] Licensed Clinical Social Worker Direct Dial: [phone]

## 2024-05-04 NOTE — Patient Instructions (Signed)
 Visit Information  Thank you for taking time to visit with me today. Please don't hesitate to contact me if I can be of assistance to you before our next scheduled appointment.  Our next appointment is by telephone on 05/21/2024 at 2:00 PM Please call the care guide team at [phone] if you need to cancel or reschedule your appointment.   Following is a copy of your care plan:   Goals Addressed             This Visit's Progress    VBCI Social Work Care Plan; LCSW       Problems:   Disease Management support and education needs related to Anxiety with Excessive Worry, and Depression: anxiety and Food Insecurity  and Housing   CSW Clinical Goal(s):   Over the next 90 days the Patient will work with Child psychotherapist to address concerns related to anxiety and depression as well as SDOH needs concerns..  Interventions:  Social Determinants of Health in Patient with Anxiety with Excessive Worry, Depression: anxiety: SDOH assessments completed: Food Insecurity  and Housing  Evaluation of current treatment plan related to unmet needs LCSW made referral to BSW to assist with needs LCSW provided encouragement to the efforts that patient has made towards housing Mental Health:  Evaluation of current treatment plan related to Anxiety with Excessive Worry, and Depression: anxiety Active listening / Reflection utilized Behavioral Activation reviewed Depression screen reviewed and compared to last screening Emotional Support Provided Motivational Interviewing employed PHQ2/PHQ9 completed and previous results compared Solution-Focued Strategies employed: Suicidal Ideation/Homicidal Ideation assessed: Patient has no plan or intent. LCSW provided 988 crisis line LCSW discussed current coping skills and encouraged patient to think of additional coping skills. LCSW encouraged patient to continue to remain compliant with her psychotropic mediations. LCSW discussed current triggers for anxiety  and depression. Patient is agreeable to therapy with a therapist that offers in person and virtual Patient Goals/Self-Care Activities:  Coordinate with BSW to assist with SDOH needs.  Plan:   Telephone follow up appointment with care management team member scheduled for:  05/21/2024 at 2;00 PM        Please call the Suicide and Crisis Lifeline: 988 if you are experiencing a Mental Health or Behavioral Health Crisis or need someone to talk to.  Patient verbalizes understanding of instructions and care plan provided today and agrees to view in MyChart. Active MyChart status and patient understanding of how to access instructions and care plan via MyChart confirmed with patient.     Olam Ally, MSW, LCSW [place]  [facility], [facility] Licensed Clinical Social Worker Direct Dial: [phone]

## 2024-05-08 ENCOUNTER — Telehealth: Payer: Self-pay | Admitting: *Deleted

## 2024-05-08 NOTE — Progress Notes (Signed)
 Complex Care Management Note Care Guide Note  05/08/2024 Name: DAINE CROKER MRN: [id number] DOB: 10-17-1966   Complex Care Management Outreach Attempts: An unsuccessful telephone outreach was attempted today to offer the patient information about available complex care management services.  Follow Up Plan:  Additional outreach attempts will be made to offer the patient complex care management information and services.   Encounter Outcome:  No Answer  [facility] Care Guide  Direct Dial:[phone] [phone] Website: [email or web address]

## 2024-05-12 ENCOUNTER — Telehealth: Payer: Self-pay | Admitting: *Deleted

## 2024-05-12 NOTE — Progress Notes (Signed)
 Complex Care Management Note Care Guide Note  05/12/2024 Name: Diane Knapp MRN: [id number] DOB: [birth year]  Diane Knapp is a 58 y.o. year old female who is a primary care patient of [name], FNP . The community resource team was consulted for assistance with Financial Difficulties related to housing and food   SDOH screenings and interventions completed:  Yes     SDOH Interventions Today    Flowsheet Row Most Recent Value  SDOH Interventions   Food Insecurity Interventions Community Resources Provided  Housing Interventions Community Resources Provided  [On all housing lists has an apartment needs funding]     Care guide performed the following interventions: Patient provided with information about care guide support team and interviewed to confirm resource needs.  Follow Up Plan:  No further follow up planned at this time. The patient has been provided with needed resources.  Encounter Outcome:  Patient Visit Completed  Koven Belinsky [place]  [facility] Health[facility] Care Guide  Direct Dial:[phone] [phone] Website: [email or web address]

## 2024-05-15 ENCOUNTER — Encounter: Payer: Self-pay | Admitting: Family

## 2024-05-15 ENCOUNTER — Ambulatory Visit: Admitting: Family

## 2024-05-15 VITALS — BP 138/82 | HR 66 | Ht 67.0 in | Wt 164.0 lb

## 2024-05-15 DIAGNOSIS — R002 Palpitations: Secondary | ICD-10-CM

## 2024-05-15 DIAGNOSIS — M7989 Other specified soft tissue disorders: Secondary | ICD-10-CM

## 2024-05-19 ENCOUNTER — Other Ambulatory Visit: Payer: Self-pay

## 2024-05-19 ENCOUNTER — Encounter: Payer: Self-pay | Admitting: Psychiatry

## 2024-05-19 ENCOUNTER — Ambulatory Visit (INDEPENDENT_AMBULATORY_CARE_PROVIDER_SITE_OTHER): Admitting: Psychiatry

## 2024-05-19 VITALS — BP 145/82 | HR 62 | Temp 97.7°F | Ht 67.0 in | Wt 164.6 lb

## 2024-05-19 DIAGNOSIS — R0683 Snoring: Secondary | ICD-10-CM | POA: Insufficient documentation

## 2024-05-19 DIAGNOSIS — F331 Major depressive disorder, recurrent, moderate: Secondary | ICD-10-CM | POA: Diagnosis not present

## 2024-05-19 DIAGNOSIS — F17200 Nicotine dependence, unspecified, uncomplicated: Secondary | ICD-10-CM | POA: Diagnosis not present

## 2024-05-19 DIAGNOSIS — F411 Generalized anxiety disorder: Secondary | ICD-10-CM

## 2024-05-19 DIAGNOSIS — F4310 Post-traumatic stress disorder, unspecified: Secondary | ICD-10-CM | POA: Diagnosis not present

## 2024-05-19 DIAGNOSIS — Z9189 Other specified personal risk factors, not elsewhere classified: Secondary | ICD-10-CM | POA: Insufficient documentation

## 2024-05-19 MED ORDER — OLANZAPINE 7.5 MG PO TABS: 7.5000 mg | ORAL_TABLET | Freq: Every day | ORAL | 0 refills | Status: DC

## 2024-05-19 NOTE — Progress Notes (Unsigned)
 BH MD OP Progress Note  05/20/2024 8:34 AM [name]  MRN:  [id number]  Chief Complaint:  Chief Complaint  Patient presents with   Follow-up   Depression   Anxiety   Medication Refill   Discussed the use of AI scribe software for clinical note transcription with the patient, who gave verbal consent to proceed.  History of Present Illness Diane Knapp is a 58 year old African-American female, on disability, currently homeless, lives with a friend in [place], is separated, has a history of depression, PTSD, generalized anxiety disorder, radiculopathy of lumbar region, hypertension, chronic pain, gastroesophageal reflux disease, COPD, neuropathy, fibromyalgia was evaluated in office today for a follow-up appointment.    Ongoing mood instability continues, with her describing occasional outbursts and crying spells since the last visit. She tries to hold in her emotions and prefers to isolate herself when feeling irritable, often leaving her friend's house to sleep in her car to avoid confrontation. When she becomes ill-tempered, she describes herself as a 'mean person' and notes that she is currently not speaking to family members, which contributes to her preference for isolation. She identifies her primary stressor as her unstable housing situation and the inability to secure her own place due to financial barriers, which she finds distressing. She continues to feel significant frustration with her current living arrangements and the impact on her mental well-being.  She continues to experience disrupted sleep, with difficulty returning to sleep after waking in the night, often due to bathroom needs. She goes to bed around 8:00 PM and wakes at 4:00-4:30 AM, estimating about 7 hours of sleep per night but feeling unrested upon waking. She does not take naps during the day. When she is unable to sleep, she uses her phone or listens to meditative music, though these activities do not help her  return to sleep. She expresses a desire for more sleep, feeling that 7 hours is insufficient, especially given her disability status. After she stopped mirtazapine , she notes reduced oversedation and currently takes fluoxetine  60 mg and olanzapine  5 mg. She states that the medication regimen is going all right and continues to take her prescribed medications as directed.  She denies experiencing auditory or visual hallucinations. She denies any thoughts of hurting herself or others when directly asked by the provider.  She reports current use of cigarettes and nicotine  vapes. She denies current or past use of cannabis in vapes. She states she previously smoked cannabis but does not currently use it.   She has a case Theatre manager with needs.    Visit Diagnosis:    ICD-10-CM   1. MDD (major depressive disorder), recurrent episode, moderate (HCC)  F33.1 OLANZapine  (ZYPREXA ) 7.5 MG tablet    2. PTSD (post-traumatic stress disorder)  F43.10     3. GAD (generalized anxiety disorder)  F41.1     4. Tobacco use disorder  F17.200     5. Snoring  R06.83 Ambulatory referral to Pulmonology    6. At risk for prolonged QT interval syndrome  Z91.89 EKG 12-Lead      Past Psychiatric History: I have reviewed past psych history from progress note on 01/28/2024.  Past trials of medications like Wellbutrin , mirtazapine , Prozac , olanzapine  and several others.  Past Medical History:  Past Medical History:  Diagnosis Date   Anxiety    Callus of foot    right bottom of foot- removed 11/24/15   Chronic cervical radicular pain (Right) 10/12/2015   COPD (chronic obstructive pulmonary disease) (HCC)  Depression    Encounter for long-term (current) use of medications 08/31/2015   Encounter for therapeutic drug level monitoring 11/24/2015   Hypertension    Hypomagnesemia 10/27/2015   Marijuana use (see 11/24/2015 UDS) 08/31/2015   Myelomalacia (HCC)    Noncompliance 08/31/2015   Patient previously  discharged from [facility] pain clinic secondary to missing appointments. 10/12/2015 - patient is taking more medication than prescribed. 12/22/2015 - opioid analgesic pain management treatment option terminated today due to noncompliance with medication policy and signed medication agreement. Patient tested positive for cannabinoids on the 11/24/2015 UDS. [facility]   Noncompliance with medication treatment due to overuse of medication 10/12/2015   Spondylosis    Substance use disorder Risk: HIGH 08/31/2015   Previously found to have a UDS positive for unreported use of ETOH and cannabinoids on 12/07/2013.       Past Surgical History:  Procedure Laterality Date   RIGHT OOPHORECTOMY     SPINE SURGERY      Family Psychiatric History: I have reviewed family psychiatric history from progress note on 01/28/2024.  Family History:  Family History  Problem Relation Age of Onset   Depression Mother    Diabetes Mother    Cancer Mother    Stroke Mother    Hypertension Mother    Schizophrenia Sister    Depression Sister    Drug abuse Brother    Depression Brother    Drug abuse Maternal Uncle    Drug abuse Paternal Uncle    Drug abuse Cousin     Social History: I have reviewed social history from progress note on 01/28/2024. Social History   Socioeconomic History   Marital status: Legally Separated    Spouse name: Not on file   Number of children: 2   Years of education: Not on file   Highest education level: 11th grade  Occupational History   Not on file  Tobacco Use   Smoking status: Every Day    Current packs/day: 0.50    Types: Cigarettes   Smokeless tobacco: Not on file  Vaping Use   Vaping status: Never Used  Substance and Sexual Activity   Alcohol use: Yes    Alcohol/week: 1.0 standard drink of alcohol    Types: 1 Shots of liquor per week    Comment: occasionally   Drug use: Not Currently    Types: Marijuana   Sexual activity: Not Currently  Other Topics  Concern   Not on file  Social History Narrative   Left handed   Caffeine 6-8 cups daily   Homeless, lives in her car   Social Drivers of Health   Financial Resource Strain: Not on file  Food Insecurity: Food Insecurity Present (05/04/2024)   Hunger Vital Sign    Worried About Running Out of Food in the Last Year: Sometimes true    Ran Out of Food in the Last Year: Sometimes true  Transportation Needs: No Transportation Needs (05/04/2024)   PRAPARE - Administrator, Civil Service (Medical): No    Lack of Transportation (Non-Medical): No  Physical Activity: Not on file  Stress: Not on file  Social Connections: Not on file    Allergies:  Allergies  Allergen Reactions   Duloxetine Other (See Comments)   Lisinopril  Cough   Cymbalta [Duloxetine Hcl] Other (See Comments)    hallucinations    Metabolic Disorder Labs: Lab Results  Component Value Date   HGBA1C 5.6 03/18/2024   No results found for: PROLACTIN  Lab Results  Component Value Date   CHOL 170 03/18/2024   TRIG 98 03/18/2024   HDL 58 03/18/2024   CHOLHDL 2.9 03/18/2024   LDLCALC 94 03/18/2024   LDLCALC 102 (H) 10/29/2023   Lab Results  Component Value Date   TSH 0.647 03/18/2024   TSH 0.620 10/29/2023    Therapeutic Level Labs: No results found for: LITHIUM No results found for: VALPROATE No results found for: CBMZ  Current Medications: Current Outpatient Medications  Medication Sig Dispense Refill   OLANZapine  (ZYPREXA ) 7.5 MG tablet Take 1 tablet (7.5 mg total) by mouth at bedtime. Dose increase 90 tablet 0   amLODipine  (NORVASC ) 5 MG tablet Take 1 tablet (5 mg total) by mouth daily. 30 tablet 11   aspirin  EC 81 MG tablet Take 1 tablet (81 mg total) by mouth daily. Swallow whole. 30 tablet 12   Cholecalciferol (VITAMIN D3) 2000 units capsule Take 1 capsule (2,000 Units total) by mouth daily. 30 capsule PRN   FLUoxetine  HCl 60 MG TABS Take 60 mg by mouth daily with breakfast. Stop  Fluoxetine  40 mg 30 tablet 1   isosorbide  mononitrate (IMDUR ) 30 MG 24 hr tablet Take 1 tablet by mouth once daily 30 tablet 0   losartan  (COZAAR ) 50 MG tablet Take 1 tablet (50 mg total) by mouth daily. 90 tablet 1   predniSONE  (DELTASONE ) 20 MG tablet Take 2 tablets (40 mg total) by mouth daily with breakfast. 10 tablet 0   pregabalin  (LYRICA ) 100 MG capsule Take 1 capsule (100 mg total) by mouth 2 (two) times daily. 60 capsule 2   propranolol  (INDERAL ) 20 MG tablet Take 1 tablet (20 mg total) by mouth 3 (three) times daily. 90 tablet 11   varenicline  (CHANTIX ) 0.5 MG tablet Take 1 tablet (0.5 mg total) by mouth daily. 30 tablet 1   Vitamin D , Ergocalciferol , (DRISDOL ) 1.25 MG (50000 UNIT) CAPS capsule Take 1 capsule (50,000 Units total) by mouth every 7 (seven) days. 12 capsule 3   No current facility-administered medications for this visit.     Musculoskeletal: Strength & Muscle Tone: limited walks with a cane Gait & Station: slow, walks with cane Patient leans: N/A  Psychiatric Specialty Exam: Review of Systems  Psychiatric/Behavioral:  Positive for agitation, dysphoric mood and sleep disturbance. The patient is nervous/anxious.     Blood pressure (!) 145/82, pulse 62, temperature 97.7 F (36.5 C), temperature source Temporal, height 5' 7 (1.702 m), weight 164 lb 9.6 oz (74.7 kg).Body mass index is 25.78 kg/m.  General Appearance: Casual  Eye Contact:  Fair  Speech:  Clear and Coherent  Volume:  Normal  Mood:  Angry, Anxious, Depressed, and Irritable  Affect:  Irritable, angry  Thought Process:  Goal Directed and Descriptions of Associations: Intact  Orientation:  Full (Time, Place, and Person)  Thought Content: Rumination   Suicidal Thoughts:  No  Homicidal Thoughts:  No  Memory:  Immediate;   Fair Recent;   Fair Remote;   Poor  Judgement:  Fair  Insight:  Fair  Psychomotor Activity:  Restlessness was agitated in session  Concentration:  Concentration: Fair and  Attention Span: Fair  Recall:  [employer] of Knowledge: Fair  Language: Fair  Akathisia:  No  Handed:  Left  AIMS (if indicated): done  Assets:  Communication Skills Desire for Improvement Transportation  ADL's:  Intact  Cognition: WNL  Sleep:  Although getting 7 hours of sleep reports she feels unrested   Screenings: AIMS  Flowsheet Row Office Visit from 05/19/2024 in [facility] Office Visit from 03/31/2024 in [facility] Office Visit from 01/28/2024 in [facility]  AIMS Total Score 0 0 0   GAD-7    Flowsheet Row Patient Outreach Telephone from 05/04/2024 in [place] POPULATION HEALTH DEPARTMENT Office Visit from 01/28/2024 in [facility] Office Visit from 11/18/2023 in [facility]  Total GAD-7 Score 18 11 16    PHQ2-9    Flowsheet Row Patient Outreach Telephone from 05/04/2024 in [place] POP[facility]ATION HE[facility] DEPARTMENT Office Visit from 01/28/2024 in [facility] Office Visit from 11/18/2023 in [facility] Clinical Support from 12/22/2015 in [place] [facility] Clinical Support from 11/24/2015 in [facility] Total Score 6 3 5  0 0  PHQ-9 Total Score 19 13 16  -- --   Flowsheet Row Office Visit from 05/19/2024 in [facility] Office Visit from 03/31/2024 in [facility] Office Visit from 01/28/2024 in [facility]  C-SSRS RISK CATEGORY Moderate Risk Moderate Risk Moderate Risk     Assessment and Plan: PRINCESSA LESMEISTER is a 58 year old African-American female with history of depression, anxiety, PTSD was evaluated in office  today for a follow-up appointment.  Discussed assessment and plan as noted below.  MDD-unstable PTSD-unstable Generalized anxiety disorder-unstable Chronic depression as well as recent mood lability including agitation, irritability, anxiety as well as trauma related symptoms including hypervigilance hyperarousal, avoidance concentration problems and sleep problems.  Will benefit from dosage readjustment of medications. Increase Olanzapine  to 7.5 mg at bedtime Continue Fluoxetine  60 mg daily  Snoring-unstable Reports snoring as well as feeling rested when she wakes up in the morning after 7 hours of sleep. Ambulatory referral to pulmonology for sleep study.  Tobacco use disorder-unstable Patient continues to smoke cigarettes.  Previously prescribed Chantix . Continue Chantix  0.5 mg daily.  At risk for prolonged QT syndrome-we will order EKG.  Patient to call [phone] and get this completed after 7 to 10 days of starting the higher dosage of olanzapine .  Patient provided housing resources in the community.   Collaboration of Care: Collaboration of Care: Referral or follow-up with counselor/therapist AEB patient encouraged to establish care with therapist.  Patient/Guardian was advised Release of Information must be obtained prior to any record release in order to collaborate their care with an outside provider. Patient/Guardian was advised if they have not already done so to contact the registration department to sign all necessary forms in order for us  to release information regarding their care.   Consent: Patient/Guardian gives verbal consent for treatment and assignment of benefits for services provided during this visit. Patient/Guardian expressed understanding and agreed to proceed.   This note was generated in part or whole with voice recognition software. Voice recognition is usually quite accurate but there are transcription errors that can and very often do occur. I apologize  for any typographical errors that were not detected and corrected.    Caylen Kuwahara, MD 05/20/2024, 8:34 AM

## 2024-05-19 NOTE — Patient Instructions (Signed)
 Please call for [phone]

## 2024-05-20 ENCOUNTER — Other Ambulatory Visit: Payer: Self-pay | Admitting: *Deleted

## 2024-05-20 NOTE — Patient Outreach (Signed)
 Complex Care Management   Visit Note  07/30/2024 updated note for 05/20/24  Name:  Diane Knapp MRN: [id number] DOB: Jun 15, 1966  Situation: Referral received for Complex Care Management related to SDOH Barriers:  Housing finances Food insecurity I obtained verbal consent from Patient.  Visit completed with Elora Ned  on the phone   Happy pre birthday to her   Pain -no medicine feet swollen, no fluid pill  Disabled- walks with a cane  leg does not want to bend  States she felt her previous therapist made assumptions about her and almost made her want to stop seeing doctors. She discusses other therapist and concludes that she has not seen anyone that has offend her  Saw psychiatrist on 05/19/24 who got agitated with her after patient expressed herself, discussed alternative treatment  Family support Speaks with sister, son, daughter has her grandchildren Admits she use to smoke marijuana  Reports she was so irritated yesterday and this lead her to get in a conflict with her brother Fernanda hopping no stable housing- best friend off & on, brother Having half sister to visit her in [place]  To try a new therapist Word finding is very frustrating to her  Stay in her car a lot to place to stay to prevent confrontation, others acting   Working with [employer] SW  Reviewed information about GORMAN Barth, MD   Housing Having half sister to visit her in [place] Need funds to help pay her deposit on an apartment Get $800 per month  She has called [employer] without success   Background:   Past Medical History:  Diagnosis Date   Anxiety    Callus of foot    right bottom of foot- removed 11/24/15   Chronic cervical radicular pain (Right) 10/12/2015   COPD (chronic obstructive pulmonary disease) (HCC)    Depression    Encounter for long-term (current) use of medications 08/31/2015   Encounter for therapeutic drug level monitoring 11/24/2015   Hypertension    Hypomagnesemia  10/27/2015   Marijuana use (see 11/24/2015 UDS) 08/31/2015   Myelomalacia (HCC)    Noncompliance 08/31/2015   Patient previously discharged from [facility] pain clinic secondary to missing appointments. 10/12/2015 - patient is taking more medication than prescribed. 12/22/2015 - opioid analgesic pain management treatment option terminated today due to noncompliance with medication policy and signed medication agreement. Patient tested positive for cannabinoids on the 11/24/2015 UDS. [facility]   Noncompliance with medication treatment due to overuse of medication 10/12/2015   Spondylosis    Substance use disorder Risk: HIGH 08/31/2015   Previously found to have a UDS positive for unreported use of ETOH and cannabinoids on 12/07/2013.       Assessment: Patient Reported Symptoms:  Cognitive Cognitive Status: Difficulties with attention and concentration, Requires Assistance Decision Making Cognitive/Intellectual Conditions Management [RPT]: Behavior Disorders   Health Maintenance Behaviors: Sleep adequate, Social activities Healing Pattern: Unsure Health Facilitated by: Rest, Pain control  Neurological Neurological Review of Symptoms: Headaches Neurological Management Strategies: Routine screening, Medication therapy Neurological Self-Management Outcome: 3 (uncertain)  HEENT HEENT Symptoms Reported: No symptoms reported HEENT Self-Management Outcome: 4 (good)    Cardiovascular Cardiovascular Symptoms Reported: Fatigue Does patient have uncontrolled Hypertension?: Yes Is patient checking Blood Pressure at home?: No Cardiovascular Management Strategies: Activity, Medication therapy, Routine screening Cardiovascular Self-Management Outcome: 3 (uncertain)  Respiratory Respiratory Symptoms Reported: No symptoms reported Respiratory Management Strategies: Routine screening Respiratory Self-Management Outcome: 4 (good)  Endocrine Endocrine Symptoms Reported: No symptoms  reported Endocrine Self-Management Outcome: 4 (good)  Gastrointestinal Gastrointestinal Symptoms Reported: No symptoms reported Gastrointestinal Self-Management Outcome: 4 (good)    Genitourinary Genitourinary Symptoms Reported: No symptoms reported Genitourinary Self-Management Outcome: 4 (good)  Integumentary Integumentary Symptoms Reported: Bruising Skin Management Strategies: Routine screening Skin Self-Management Outcome: 3 (uncertain)  Musculoskeletal Musculoskelatal Symptoms Reviewed: Back pain, Muscle pain Additional Musculoskeletal Details: slow walking swelling feet Musculoskeletal Management Strategies: Medical device, Medication therapy, Routine screening Musculoskeletal Self-Management Outcome: 3 (uncertain)      Psychosocial Psychosocial Symptoms Reported: Avoiding people, places, activities, Difficulty concentrating Behavioral Management Strategies: Activity, Medication therapy, Support system, Coping strategies Behavioral Health Self-Management Outcome: 3 (uncertain) Major Change/Loss/Stressor/Fears (CP): Medical condition, self, Relationship concerns, Resources Techniques to [place] with Loss/Stress/Change: Diversional activities, Medication, Substance use, Withdraw Quality of Family Relationships: disruptive, helpful Do you feel physically threatened by others?: No      06/04/2024   11:48 AM  Depression screen PHQ 2/9  Decreased Interest 2  Down, Depressed, Hopeless 2  PHQ - 2 Score 4  Altered sleeping 2  Tired, decreased energy 2  Change in appetite 3  Feeling bad or failure about yourself  2  Trouble concentrating 2  Moving slowly or fidgety/restless 0  Suicidal thoughts 0  PHQ-9 Score 15  Difficult doing work/chores Very difficult    There were no vitals filed for this visit.  Medications Reviewed Today   Medications were not reviewed in this encounter     Recommendation:   PCP Follow-up Specialty provider follow-up therapist Continue Current Plan  of Care Try other foundations to help with housing  Follow Up Plan:   Telephone follow up appointment date/time:  08/07/23 3 pm  Zylpha Poynor L. Ramonita, RN, BSN, CCM [place]  [facility], [facility] RN Care Manager Direct Dial: [phone]  Fax: [phone]

## 2024-05-21 ENCOUNTER — Other Ambulatory Visit: Payer: Self-pay

## 2024-05-21 NOTE — Patient Instructions (Signed)
 Visit Information  Thank you for taking time to visit with me today. Please don't hesitate to contact me if I can be of assistance to you before our next scheduled appointment.  Your next care management appointment is by telephone on 06/04/2024 at 2;30 PM   Please call the care guide team at [phone] if you need to cancel, schedule, or reschedule an appointment.   Please call the Suicide and Crisis Lifeline: 988 if you are experiencing a Mental Health or Behavioral Health Crisis or need someone to talk to.  Olam Ally, MSW, LCSW [place]  [facility], [facility] Licensed Clinical Social Worker Direct Dial: [phone]

## 2024-05-21 NOTE — Patient Outreach (Signed)
 Complex Care Management   Visit Note  05/21/2024  Name:  Diane Knapp MRN: [id number] DOB: 1966/02/15  Situation: Referral received for Complex Care Management related to Mental/Behavioral Health diagnosis anxiety and depression and SDOH Barriers:  Housing  and Food insecurity. I obtained verbal consent from Patient.  Visit completed with patient  on the phone. LCSW spoke with patient for scheduled visit.  LCSW spoke with patient about her mental health and assessed her emotional well-being. Patient reports that she feels that her anxiety and depression is about the same and agreed for assessment to e completed again at next visit. LCSW assisted patient with calling [facility] for an appointment and waiting to hear back. Background:   Past Medical History:  Diagnosis Date   Anxiety    Callus of foot    right bottom of foot- removed 11/24/15   Chronic cervical radicular pain (Right) 10/12/2015   COPD (chronic obstructive pulmonary disease) (HCC)    Depression    Encounter for long-term (current) use of medications 08/31/2015   Encounter for therapeutic drug level monitoring 11/24/2015   Hypertension    Hypomagnesemia 10/27/2015   Marijuana use (see 11/24/2015 UDS) 08/31/2015   Myelomalacia (HCC)    Noncompliance 08/31/2015   Patient previously discharged from [facility] pain clinic secondary to missing appointments. 10/12/2015 - patient is taking more medication than prescribed. 12/22/2015 - opioid analgesic pain management treatment option terminated today due to noncompliance with medication policy and signed medication agreement. Patient tested positive for cannabinoids on the 11/24/2015 UDS. [facility]   Noncompliance with medication treatment due to overuse of medication 10/12/2015   Spondylosis    Substance use disorder Risk: HIGH 08/31/2015   Previously found to have a UDS positive for unreported use of ETOH and cannabinoids on 12/07/2013.        Assessment: Patient Reported Symptoms:  Cognitive Cognitive Status: Alert and oriented to person, place, and time, Normal speech and language skills Cognitive/Intellectual Conditions Management [RPT]: None reported or documented in medical history or problem list      Neurological Neurological Review of Symptoms: No symptoms reported    HEENT HEENT Symptoms Reported: No symptoms reported      Cardiovascular Cardiovascular Symptoms Reported: No symptoms reported Does patient have uncontrolled Hypertension?: Yes Is patient checking Blood Pressure at home?: Yes Patient's Recent BP reading at home: Patient cannot rememver reading Cardiovascular Management Strategies: Adequate rest, Medication therapy  Respiratory Respiratory Symptoms Reported: No symptoms reported    Endocrine Endocrine Symptoms Reported: No symptoms reported (patient reports being a pre-diabetic) Is patient diabetic?: No    Gastrointestinal Gastrointestinal Symptoms Reported: Cramping Additional Gastrointestinal Details: some light cramping, patient reports holding urine too long, weak bladder Gastrointestinal Management Strategies: Adequate rest    Genitourinary Genitourinary Symptoms Reported: Other, Urgency Other Genitourinary Symptoms: Patient reports holding urine too long Genitourinary Management Strategies: Incontinence garment/pad Genitourinary Self-Management Outcome: 4 (good)  Integumentary Integumentary Symptoms Reported: Bruising Additional Integumentary Details: Patient reports unexplained bruises, she will tell PCP    Musculoskeletal Musculoskelatal Symptoms Reviewed: Joint pain Additional Musculoskeletal Details: slow walking swelling feet Musculoskeletal Management Strategies: Medical device, Routine screening Falls in the past year?: Yes (Patient reports that legs will give out) Number of falls in past year: 2 or more Was there an injury with Fall?: No (Patient reports no major  injuries) Fall Risk Category Calculator: 2 Patient Fall Risk Level: Moderate Fall Risk Patient at Risk for Falls Due to: Impaired balance/gait Fall risk Follow  up: Falls prevention discussed (LCSW encourages patient to utilize cane or walker when walking)  Psychosocial Psychosocial Symptoms Reported: Anxiety - if selected complete GAD, Depression - if selected complete PHQ 2-9 Additional Psychological Details: Patient reports that depression and anxiety is about the same since last assessed. Patient is agreeable to assessment at next visit. Behavioral Management Strategies: Medication therapy, Coping strategies (Patient is agreeable to counseling) Major Change/Loss/Stressor/Fears (CP): Resources, Environment, Medical condition, self Behaviors When Feeling Stressed/Fearful: Patient reports feeling like she is a failure Techniques to Cope with Loss/Stress/Change: Medication, Diversional activities, Counseling (Patient is agreeable to counseling) Quality of Family Relationships: stressful, supportive Do you feel physically threatened by others?: No      05/04/2024    3:16 PM  Depression screen PHQ 2/9  Decreased Interest 3  Down, Depressed, Hopeless 3  PHQ - 2 Score 6  Altered sleeping 3  Tired, decreased energy 3  Change in appetite 2  Feeling bad or failure about yourself  3  Trouble concentrating 2  Moving slowly or fidgety/restless 0  Suicidal thoughts 0  PHQ-9 Score 19  Difficult doing work/chores Very difficult    There were no vitals filed for this visit.  Medications Reviewed Today   Medications were not reviewed in this encounter     Recommendation:   Continue Current Plan of Care  Follow Up Plan:   06/04/2024 at 2:30 PM  Olam Ally, MSW, LCSW [place]  [facility], [facility] Licensed Clinical Social Worker Direct Dial: [phone]

## 2024-05-26 ENCOUNTER — Ambulatory Visit (INDEPENDENT_AMBULATORY_CARE_PROVIDER_SITE_OTHER): Admitting: Sleep Medicine

## 2024-05-26 ENCOUNTER — Encounter: Payer: Self-pay | Admitting: Sleep Medicine

## 2024-05-26 VITALS — BP 120/80 | HR 64 | Temp 97.7°F | Ht 67.0 in | Wt 167.4 lb

## 2024-05-26 DIAGNOSIS — I10 Essential (primary) hypertension: Secondary | ICD-10-CM | POA: Diagnosis not present

## 2024-05-26 DIAGNOSIS — G4733 Obstructive sleep apnea (adult) (pediatric): Secondary | ICD-10-CM | POA: Diagnosis not present

## 2024-05-26 DIAGNOSIS — F5104 Psychophysiologic insomnia: Secondary | ICD-10-CM

## 2024-05-26 NOTE — Patient Instructions (Signed)
 Diane Knapp

## 2024-05-26 NOTE — Progress Notes (Unsigned)
 Name:Diane Knapp MRN: [id number] DOB: 11/05/1965   CHIEF COMPLAINT:  EXCESSIVE DAYTIME SLEEPINESS   HISTORY OF PRESENT ILLNESS:  Diane Knapp is a 58 y.o. w/ a h/o HTN, anxiety, depression, bipolar disorder and schizophrenia who present for c/o loud snoring and witnessed apnea which has been present for several years. Reports nocturnal awakenings due to nocturia, however does not have difficulty falling back to sleep. Denies any significant weight changes. Admits to night sweats, dry mouth and morning headaches. Denies RLS symptoms, dream enactment, cataplexy, hypnagogic or hypnapompic hallucinations. Reports a family history of sleep apnea. Denies drowsy driving. Drinks 2 cups of coffee and 3 sodas in the afternoon. Reports occasional alcohol use, smokes 1 ppd tobacco, denies illicit drug use.   Bedtime 8-11 pm Sleep onset variable Rise time 10 am   EPWORTH SLEEP SCORE 3    05/26/2024    3:00 PM  Results of the Epworth flowsheet  Sitting and reading 0  Watching TV 0  Sitting, inactive in a public place (e.g. a theatre or a meeting) 0  As a passenger in a car for an hour without a break 3  Lying down to rest in the afternoon when circumstances permit 0  Sitting and talking to someone 0  Sitting quietly after a lunch without alcohol 0  In a car, while stopped for a few minutes in traffic 0  Total score 3    PAST MEDICAL HISTORY :   has a past medical history of Anxiety, Callus of foot, Chronic cervical radicular pain (Right) (10/12/2015), COPD (chronic obstructive pulmonary disease) (HCC), Depression, Encounter for long-term (current) use of medications (08/31/2015), Encounter for therapeutic drug level monitoring (11/24/2015), Hypertension, Hypomagnesemia (10/27/2015), Marijuana use (see 11/24/2015 UDS) (08/31/2015), Myelomalacia (HCC), Noncompliance (08/31/2015), Noncompliance with medication treatment due to overuse of medication (10/12/2015), Spondylosis, and Substance  use disorder Risk: HIGH (08/31/2015).  has a past surgical history that includes Spine surgery and Right oophorectomy. Prior to Admission medications   Medication Sig Start Date End Date Taking? Authorizing Provider  amLODipine  (NORVASC ) 5 MG tablet Take 1 tablet (5 mg total) by mouth daily. 05/13/23 05/26/24 Yes [name], FNP  aspirin  EC 81 MG tablet Take 1 tablet (81 mg total) by mouth daily. Swallow whole. 05/28/23  Yes Fernand Denyse LABOR, MD  Cholecalciferol (VITAMIN D3) 2000 units capsule Take 1 capsule (2,000 Units total) by mouth daily. 10/20/15  Yes Tanya Glisson, MD  FLUoxetine  HCl 60 MG TABS Take 60 mg by mouth daily with breakfast. Stop Fluoxetine  40 mg 03/31/24  Yes Eappen, Saramma, MD  isosorbide  mononitrate (IMDUR ) 30 MG 24 hr tablet Take 1 tablet by mouth once daily 03/03/24  Yes Scoggins, Amber, NP  losartan  (COZAAR ) 50 MG tablet Take 1 tablet (50 mg total) by mouth daily. 03/04/24  Yes [name], FNP  OLANZapine  (ZYPREXA ) 7.5 MG tablet Take 1 tablet (7.5 mg total) by mouth at bedtime. Dose increase 05/19/24  Yes Eappen, Saramma, MD  predniSONE  (DELTASONE ) 20 MG tablet Take 2 tablets (40 mg total) by mouth daily with breakfast. 04/15/24  Yes [name], FNP  propranolol  (INDERAL ) 20 MG tablet Take 1 tablet (20 mg total) by mouth 3 (three) times daily. 03/18/24 03/18/25 Yes [name], FNP  varenicline  (CHANTIX ) 0.5 MG tablet Take 1 tablet (0.5 mg total) by mouth daily. 03/31/24  Yes Eappen, Saramma, MD  Vitamin D , Ergocalciferol , (DRISDOL ) 1.25 MG (50000 UNIT) CAPS capsule Take 1 capsule (50,000 Units total)  by mouth every 7 (seven) days. 11/11/23  Yes [name], FNP  pregabalin  (LYRICA ) 100 MG capsule Take 1 capsule (100 mg total) by mouth 2 (two) times daily. Patient not taking: Reported on 05/26/2024 03/18/24   [name], FNP   Allergies  Allergen Reactions   Duloxetine Other (See Comments)   Lisinopril  Cough   Cymbalta [Duloxetine Hcl] Other  (See Comments)    hallucinations   Lyrica  [Pregabalin ] Other (See Comments)    Causes disorientation, confusion    FAMILY HISTORY:  family history includes Cancer in her mother; Depression in her brother, mother, and sister; Diabetes in her mother; Drug abuse in her brother, cousin, maternal uncle, and paternal uncle; Hypertension in her mother; Schizophrenia in her sister; Stroke in her mother. SOCIAL HISTORY:  reports that she has been smoking cigarettes. She does not have any smokeless tobacco history on file. She reports current alcohol use of about 1.0 standard drink of alcohol per week. She reports that she does not currently use drugs after having used the following drugs: Marijuana.   Review of Systems:  Gen:  Denies  fever, sweats, chills weight loss  HEENT: Denies blurred vision, double vision, ear pain, eye pain, hearing loss, nose bleeds, sore throat Cardiac:  No dizziness, chest pain or heaviness, chest tightness,edema, No JVD Resp:   No cough, -sputum production, -shortness of breath,-wheezing, -hemoptysis,  Gi: Denies swallowing difficulty, stomach pain, nausea or vomiting, diarrhea, constipation, bowel incontinence Gu:  Denies bladder incontinence, burning urine Ext:   Denies Joint pain, stiffness or swelling Skin: Denies  skin rash, easy bruising or bleeding or hives Endoc:  Denies polyuria, polydipsia , polyphagia or weight change Psych:   Denies depression, insomnia or hallucinations  Other:  All other systems negative  VITAL SIGNS: BP 120/80 (BP Location: Right Arm, Patient Position: Sitting, Cuff Size: Large)   Pulse 64   Temp 97.7 F (36.5 C) (Oral)   Ht 5' 7 (1.702 m)   Wt 167 lb 6.4 oz (75.9 kg)   SpO2 98%   BMI 26.22 kg/m    Physical Examination:   General Appearance: No distress  EYES PERRLA, EOM intact.   NECK Supple, No JVD Pulmonary: normal breath sounds, No wheezing.  CardiovascularNormal S1,S2.  No m/r/g.   Abdomen: Benign, Soft,  non-tender. Skin:   warm, no rashes, no ecchymosis  Extremities: normal, no cyanosis, clubbing. Neuro:without focal findings,  speech normal  PSYCHIATRIC: Mood, affect within normal limits.   ASSESSMENT AND PLAN  OSA I suspect that OSA is likely present due to clinical presentation. Discussed the consequences of untreated sleep apnea. Advised not to drive drowsy for safety of patient and others. Will complete further evaluation with a home sleep study and follow up to review results.    HTN Stable, on current management. Following with PCP.   Insomnia Counseled patient on stimulus control and improving sleep hygiene practices.    MEDICATION ADJUSTMENTS/LABS AND TESTS ORDERED: Recommend Sleep Study   Patient  satisfied with Plan of action and management. All questions answered  Follow up to review HST results and treatment plan.   I spent a total of 32 minutes reviewing chart data, face-to-face evaluation with the patient, counseling and coordination of care as detailed above.    Cambren Helm, M.D.  Sleep Medicine [facility]

## 2024-05-30 ENCOUNTER — Other Ambulatory Visit: Payer: Self-pay | Admitting: Family

## 2024-05-31 ENCOUNTER — Encounter: Payer: Self-pay | Admitting: Family

## 2024-05-31 NOTE — Assessment & Plan Note (Signed)
 Patient is seen by pulmonary, who manage this condition.  She is well controlled with current therapy.   Will defer to them for further changes to plan of care.

## 2024-05-31 NOTE — Assessment & Plan Note (Signed)
 Checking labs today.  Will continue supplements as needed.   - Vitamin D  - Vitamin B12 - TSH

## 2024-05-31 NOTE — Assessment & Plan Note (Signed)
 Patient is seen by neurology, who manage this condition.  She is well controlled with current therapy.   Will defer to them for further changes to plan of care.

## 2024-05-31 NOTE — Assessment & Plan Note (Signed)
 Blood pressure well controlled with current medications.  Continue current therapy.  Will reassess at follow up.   - CBC w/Diff - CMP w/eGFR

## 2024-05-31 NOTE — Progress Notes (Signed)
 Established Patient Office Visit  Subjective:  Patient ID: Diane Knapp, female    DOB: 1966/10/07  Age: 58 y.o. MRN: [id number]  Chief Complaint  Patient presents with   Follow-up    2 month follow up    Patient is here today for her 2 months follow up.  She has been feeling fairly well since last appointment.   She does have additional concerns to discuss today.  Has been having some chest pains/pressure and palpitations.  She was previously set up with Cardiology, but hasn't seen them in a while.   Labs are due today.  She needs refills.   I have reviewed her active problem list, medication list, allergies, notes from last encounter, lab results for her appointment today.      No other concerns at this time.   Past Medical History:  Diagnosis Date   Anxiety    Callus of foot    right bottom of foot- removed 11/24/15   Chronic cervical radicular pain (Right) 10/12/2015   COPD (chronic obstructive pulmonary disease) (HCC)    Depression    Encounter for long-term (current) use of medications 08/31/2015   Encounter for therapeutic drug level monitoring 11/24/2015   Hypertension    Hypomagnesemia 10/27/2015   Marijuana use (see 11/24/2015 UDS) 08/31/2015   Myelomalacia (HCC)    Noncompliance 08/31/2015   Patient previously discharged from [facility] pain clinic secondary to missing appointments. 10/12/2015 - patient is taking more medication than prescribed. 12/22/2015 - opioid analgesic pain management treatment option terminated today due to noncompliance with medication policy and signed medication agreement. Patient tested positive for cannabinoids on the 11/24/2015 UDS. [facility]   Noncompliance with medication treatment due to overuse of medication 10/12/2015   Spondylosis    Substance use disorder Risk: HIGH 08/31/2015   Previously found to have a UDS positive for unreported use of ETOH and cannabinoids on 12/07/2013.       Past Surgical History:   Procedure Laterality Date   RIGHT OOPHORECTOMY     SPINE SURGERY      Social History   Socioeconomic History   Marital status: Legally Separated    Spouse name: Not on file   Number of children: 2   Years of education: Not on file   Highest education level: 11th grade  Occupational History   Not on file  Tobacco Use   Smoking status: Every Day    Current packs/day: 0.50    Types: Cigarettes   Smokeless tobacco: Not on file  Vaping Use   Vaping status: Never Used  Substance and Sexual Activity   Alcohol use: Yes    Alcohol/week: 1.0 standard drink of alcohol    Types: 1 Shots of liquor per week    Comment: occasionally   Drug use: Not Currently    Types: Marijuana   Sexual activity: Not Currently  Other Topics Concern   Not on file  Social History Narrative   Left handed   Caffeine 6-8 cups daily   Homeless, lives in her car   Social Drivers of Health   Financial Resource Strain: Not on file  Food Insecurity: Food Insecurity Present (05/04/2024)   Hunger Vital Sign    Worried About Running Out of Food in the Last Year: Sometimes true    Ran Out of Food in the Last Year: Sometimes true  Transportation Needs: No Transportation Needs (05/04/2024)   PRAPARE - Administrator, Civil Service (Medical): No  Lack of Transportation (Non-Medical): No  Physical Activity: Not on file  Stress: Not on file  Social Connections: Not on file  Intimate Partner Violence: Not At Risk (05/04/2024)   Humiliation, Afraid, Rape, and Kick questionnaire    Fear of Current or Ex-Partner: No    Emotionally Abused: No    Physically Abused: No    Sexually Abused: No    Family History  Problem Relation Age of Onset   Depression Mother    Diabetes Mother    Cancer Mother    Stroke Mother    Hypertension Mother    Schizophrenia Sister    Depression Sister    Drug abuse Brother    Depression Brother    Drug abuse Maternal Uncle    Drug abuse Paternal Uncle    Drug  abuse Cousin     Allergies  Allergen Reactions   Duloxetine Other (See Comments)   Lisinopril  Cough   Cymbalta [Duloxetine Hcl] Other (See Comments)    hallucinations   Lyrica  [Pregabalin ] Other (See Comments)    Causes disorientation, confusion    Review of Systems  Cardiovascular:  Positive for chest pain and palpitations.  Neurological:  Positive for weakness.  All other systems reviewed and are negative.      Objective:   BP (!) 170/98   Pulse 65   Ht 5' 7 (1.702 m)   Wt 170 lb (77.1 kg)   SpO2 99%   BMI 26.63 kg/m   Vitals:   03/04/24 1107  BP: (!) 170/98  Pulse: 65  Height: 5' 7 (1.702 m)  Weight: 170 lb (77.1 kg)  SpO2: 99%  BMI (Calculated): 26.62    Physical Exam Vitals and nursing note reviewed.  Constitutional:      Appearance: Normal appearance. She is normal weight.  HENT:     Head: Normocephalic.  Eyes:     Extraocular Movements: Extraocular movements intact.     Conjunctiva/sclera: Conjunctivae normal.     Pupils: Pupils are equal, round, and reactive to light.  Cardiovascular:     Rate and Rhythm: Normal rate.  Pulmonary:     Effort: Pulmonary effort is normal.  Neurological:     General: No focal deficit present.     Mental Status: She is alert and oriented to person, place, and time. Mental status is at baseline.     Motor: Weakness present.     Gait: Gait abnormal.  Psychiatric:        Mood and Affect: Mood normal.        Behavior: Behavior normal.        Thought Content: Thought content normal.        Judgment: Judgment normal.      No results found for any visits on 03/04/24.  Recent Results (from the past 2160 hours)  CMP14+EGFR     Status: Abnormal   Collection Time: 03/18/24  2:02 PM  Result Value Ref Range   Glucose 76 70 - 99 mg/dL   BUN 8 6 - 24 mg/dL   Creatinine, Ser 9.13 0.57 - 1.00 mg/dL   eGFR 79 >40 fO/fpw/8.26   BUN/Creatinine Ratio 9 9 - 23   Sodium 142 134 - 144 mmol/L   Potassium 4.2 3.5 - 5.2  mmol/L   Chloride 107 (H) 96 - 106 mmol/L   CO2 23 20 - 29 mmol/L   Calcium 9.2 8.7 - 10.2 mg/dL   Total Protein 6.7 6.0 - 8.5 g/dL   Albumin 4.4 3.8 -  4.9 g/dL   Globulin, Total 2.3 1.5 - 4.5 g/dL   Bilirubin Total <9.7 0.0 - 1.2 mg/dL   Alkaline Phosphatase 85 44 - 121 IU/L   AST 17 0 - 40 IU/L   ALT 11 0 - 32 IU/L  Lipid panel     Status: None   Collection Time: 03/18/24  2:02 PM  Result Value Ref Range   Cholesterol, Total 170 100 - 199 mg/dL   Triglycerides 98 0 - 149 mg/dL   HDL 58 >60 mg/dL   VLDL Cholesterol Cal 18 5 - 40 mg/dL   LDL Chol Calc (NIH) 94 0 - 99 mg/dL   Chol/HDL Ratio 2.9 0.0 - 4.4 ratio    Comment:                                   T. Chol/HDL Ratio                                             Men  Women                               1/2 Avg.Risk  3.4    3.3                                   Avg.Risk  5.0    4.4                                2X Avg.Risk  9.6    7.1                                3X Avg.Risk 23.4   11.0   VITAMIN D  25 Hydroxy (Vit-D Deficiency, Fractures)     Status: None   Collection Time: 03/18/24  2:02 PM  Result Value Ref Range   Vit D, 25-Hydroxy 37.5 30.0 - 100.0 ng/mL    Comment: Vitamin D  deficiency has been defined by the Institute of Medicine and an Endocrine Society practice guideline as a level of serum 25-OH vitamin D  less than 20 ng/mL (1,2). The Endocrine Society went on to further define vitamin D  insufficiency as a level between 21 and 29 ng/mL (2). 1. IOM (Institute of Medicine). 2010. Dietary reference    intakes for calcium and D. [place]: The    [employer]. 2. Holick MF, Binkley Hunt, Bischoff-Ferrari HA, et al.    Evaluation, treatment, and prevention of vitamin D     deficiency: an Endocrine Society clinical practice    guideline. JCEM. 2011 Jul; 96(7):1911-30.   Vitamin B12     Status: None   Collection Time: 03/18/24  2:02 PM  Result Value Ref Range   Vitamin B-12 529 232 - 1,245 pg/mL   CBC with Diff     Status: None   Collection Time: 03/18/24  2:02 PM  Result Value Ref Range   WBC 8.6 3.4 - 10.8 x10E3/uL   RBC 4.19 3.77 - 5.28 x10E6/uL   Hemoglobin 12.3 11.1 - 15.9 g/dL   Hematocrit 38.3  34.0 - 46.6 %   MCV 91 79 - 97 fL   MCH 29.4 26.6 - 33.0 pg   MCHC 32.1 31.5 - 35.7 g/dL   RDW 86.3 88.2 - 84.5 %   Platelets 253 150 - 450 x10E3/uL   Neutrophils 55 Not Estab. %   Lymphs 36 Not Estab. %   Monocytes 7 Not Estab. %   Eos 1 Not Estab. %   Basos 1 Not Estab. %   Neutrophils Absolute 4.8 1.4 - 7.0 x10E3/uL   Lymphocytes Absolute 3.1 0.7 - 3.1 x10E3/uL   Monocytes Absolute 0.6 0.1 - 0.9 x10E3/uL   EOS (ABSOLUTE) 0.1 0.0 - 0.4 x10E3/uL   Basophils Absolute 0.1 0.0 - 0.2 x10E3/uL   Immature Granulocytes 0 Not Estab. %   Immature Grans (Abs) 0.0 0.0 - 0.1 x10E3/uL  Hemoglobin A1c     Status: None   Collection Time: 03/18/24  2:02 PM  Result Value Ref Range   Hgb A1c MFr Bld 5.6 4.8 - 5.6 %    Comment:          Prediabetes: 5.7 - 6.4          Diabetes: >6.4          Glycemic control for adults with diabetes: <7.0    Est. average glucose Bld gHb Est-mCnc 114 mg/dL  TSH     Status: None   Collection Time: 03/18/24  2:02 PM  Result Value Ref Range   TSH 0.647 0.450 - 4.500 uIU/mL       Assessment & Plan Chest pain, unspecified type EKG in office today shows similar abnormalities to the previous.   Setting patient up for follow up with Cardiology.  Will defer to them for further treatment.   Primary hypertension Adding [place] to help with her blood pressure.  She has been on other meds previously, which have not controlled her BP.   Will reassess at follow up.   Demyelinating disease (HCC) Patient is seen by neurology, who manage this condition.  She is well controlled with current therapy.   Will defer to them for further changes to plan of care.  Pulmonary emphysema, unspecified emphysema type (HCC) Patient is seen by pulmonary, who manage  this condition.  She is well controlled with current therapy.   Will defer to them for further changes to plan of care.     Return in about 2 weeks (around 03/18/2024).   Total time spent: 20 minutes  ALAN CHRISTELLA ARRANT, FNP  03/04/2024   This document may have been prepared by [facility] Voice Recognition software and as such may include unintentional dictation errors.

## 2024-06-04 ENCOUNTER — Other Ambulatory Visit: Payer: Self-pay

## 2024-06-04 NOTE — Patient Instructions (Signed)
 Visit Information  Thank you for taking time to visit with me today. Please don't hesitate to contact me if I can be of assistance to you before our next scheduled appointment.  Your next care management appointment is by telephone on 06/18/2024 at 3:00 PM   Please call the care guide team at [phone] if you need to cancel, schedule, or reschedule an appointment.   Please call the Suicide and Crisis Lifeline: 988 if you are experiencing a Mental Health or Behavioral Health Crisis or need someone to talk to.  Olam Ally, MSW, LCSW [place]  [facility], [facility] Licensed Clinical Social Worker Direct Dial: [phone]

## 2024-06-04 NOTE — Patient Outreach (Signed)
 Complex Care Management   Visit Note  06/04/2024  Name:  Diane Knapp MRN: [id number] DOB: January 14, 1966  Situation: Referral received for Complex Care Management related to Mental/Behavioral Health diagnosis anxiety and depression and SDOH Barriers:  Housing  and Food insecurity. I obtained verbal consent from Patient.  Visit completed with patient  on the phone. LCSW spoke with patient for scheduled visit.  LCSW spoke with patient about her mental health and completed her mental health assessments. Patient reports that her anxiety has increased. Patient reports that she has not followed up with [facility] and was provided the contact information again.  Background:   Past Medical History:  Diagnosis Date   Anxiety    Callus of foot    right bottom of foot- removed 11/24/15   Chronic cervical radicular pain (Right) 10/12/2015   COPD (chronic obstructive pulmonary disease) (HCC)    Depression    Encounter for long-term (current) use of medications 08/31/2015   Encounter for therapeutic drug level monitoring 11/24/2015   Hypertension    Hypomagnesemia 10/27/2015   Marijuana use (see 11/24/2015 UDS) 08/31/2015   Myelomalacia (HCC)    Noncompliance 08/31/2015   Patient previously discharged from [facility] pain clinic secondary to missing appointments. 10/12/2015 - patient is taking more medication than prescribed. 12/22/2015 - opioid analgesic pain management treatment option terminated today due to noncompliance with medication policy and signed medication agreement. Patient tested positive for cannabinoids on the 11/24/2015 UDS. [facility]   Noncompliance with medication treatment due to overuse of medication 10/12/2015   Spondylosis    Substance use disorder Risk: HIGH 08/31/2015   Previously found to have a UDS positive for unreported use of ETOH and cannabinoids on 12/07/2013.       Assessment: Patient Reported Symptoms:  Cognitive Cognitive Status: Alert  and oriented to person, place, and time, Normal speech and language skills Cognitive/Intellectual Conditions Management [RPT]: None reported or documented in medical history or problem list      Neurological Neurological Review of Symptoms: Not assessed    HEENT HEENT Symptoms Reported: Not assessed      Cardiovascular Cardiovascular Symptoms Reported: Not assessed    Respiratory Respiratory Symptoms Reported: Not assesed    Endocrine Endocrine Symptoms Reported: Not assessed    Gastrointestinal Gastrointestinal Symptoms Reported: Not assessed      Genitourinary Genitourinary Symptoms Reported: Not assessed    Integumentary Integumentary Symptoms Reported: Not assessed    Musculoskeletal Musculoskelatal Symptoms Reviewed: Not assessed        Psychosocial Psychosocial Symptoms Reported: Anxiety - if selected complete GAD, Depression - if selected complete PHQ 2-9 Additional Psychological Details: Patient's depression has decreased some and patient's anxiety has increase some. Assessment were completed and results were discussed Behavioral Management Strategies: Medication therapy, Coping strategies (Patient report journaling is helping) Major Change/Loss/Stressor/Fears (CP): Resources, Medical condition, self Behaviors When Feeling Stressed/Fearful: Patient reports that her husband is sick which has raised her anxiety. Techniques to Cope with Loss/Stress/Change: Medication, Diversional activities (Patient reports amenable to counseling) Quality of Family Relationships: stressful, supportive Do you feel physically threatened by others?: No      06/04/2024   11:48 AM  Depression screen PHQ 2/9  Decreased Interest 2  Down, Depressed, Hopeless 2  PHQ - 2 Score 4  Altered sleeping 2  Tired, decreased energy 2  Change in appetite 3  Feeling bad or failure about yourself  2  Trouble concentrating 2  Moving slowly or fidgety/restless 0  Suicidal  thoughts 0  PHQ-9 Score 15   Difficult doing work/chores Very difficult    There were no vitals filed for this visit.  Medications Reviewed Today     Reviewed by Sherren Olam FORBES KEN (Social Worker) on 06/04/24 at 1144  Med List Status: <None>   Medication Order Taking? Sig Documenting Provider Last Dose Status Informant  amLODipine  (NORVASC ) 5 MG tablet [id number]  Take 1 tablet by mouth once daily [name], FNP  Active   aspirin  EC 81 MG tablet [id number]  Take 1 tablet (81 mg total) by mouth daily. Swallow whole. Fernand Denyse LABOR, MD  Active   Cholecalciferol (VITAMIN D3) 2000 units capsule [id number]  Take 1 capsule (2,000 Units total) by mouth daily. Tanya Glisson, MD  Active            Med Note CARMIN, [place]   Wed Mar 18, 2024  1:18 PM)    FLUoxetine  HCl 60 MG TABS [id number]  Take 60 mg by mouth daily with breakfast. Stop Fluoxetine  40 mg Eappen, Saramma, MD  Active   isosorbide  mononitrate (IMDUR ) 30 MG 24 hr tablet [id number]  Take 1 tablet by mouth once daily Scoggins, Amber, NP  Active   losartan  (COZAAR ) 50 MG tablet [id number]  Take 1 tablet (50 mg total) by mouth daily. [name], FNP  Active   OLANZapine  (ZYPREXA ) 7.5 MG tablet [id number]  Take 1 tablet (7.5 mg total) by mouth at bedtime. Dose increase Eappen, Saramma, MD  Active   predniSONE  (DELTASONE ) 20 MG tablet [id number]  Take 2 tablets (40 mg total) by mouth daily with breakfast. [name], FNP  Active   pregabalin  (LYRICA ) 100 MG capsule [id number]  Take 1 capsule (100 mg total) by mouth 2 (two) times daily.  Patient not taking: Reported on 05/26/2024   [name], FNP  Active   propranolol  (INDERAL ) 20 MG tablet [id number]  Take 1 tablet (20 mg total) by mouth 3 (three) times daily. [name], FNP  Active   varenicline  (CHANTIX ) 0.5 MG tablet [id number]  Take 1 tablet (0.5 mg total) by mouth daily. Eappen, Saramma, MD  Active   Vitamin D , Ergocalciferol , (DRISDOL ) 1.25 MG (50000 UNIT) CAPS capsule [id number]   Take 1 capsule (50,000 Units total) by mouth every 7 (seven) days. [name], FNP  Active   Med List Note Elwanda Olam FORBES KEN 05/04/24 1511): Patient is currently driving, will review medications at next visit.            Recommendation:   Continue Current Plan of Care  Follow Up Plan:   Telephone follow-up 8/28/205 at 3:00 PM Olam Sherren, MSW, LCSW [place]  [facility], [facility] Licensed Clinical Social Worker Direct Dial: [phone]

## 2024-06-04 NOTE — Progress Notes (Signed)
 Established Patient Office Visit  Subjective:  Patient ID: Diane Knapp, female    DOB: 08-Apr-1966  Age: 58 y.o. MRN: [id number]  Chief Complaint  Patient presents with   Follow-up    Patient is here today for her 1 month follow up.  She has been feeling about the same since last appointment.   She does have additional concerns to discuss today.  She needs additional help at home, asks if there are any resources we can set up for her.  She also says that she would like a referral to a different neurologist, does not feel like the previous one listened to her.   Labs are not due today.  She needs refills.   I have reviewed her active problem list, medication list, allergies, notes from last encounter, lab results for her appointment today.      No other concerns at this time.   Past Medical History:  Diagnosis Date   Anxiety    Callus of foot    right bottom of foot- removed 11/24/15   Chronic cervical radicular pain (Right) 10/12/2015   COPD (chronic obstructive pulmonary disease) (HCC)    Depression    Encounter for long-term (current) use of medications 08/31/2015   Encounter for therapeutic drug level monitoring 11/24/2015   Hypertension    Hypomagnesemia 10/27/2015   Marijuana use (see 11/24/2015 UDS) 08/31/2015   Myelomalacia (HCC)    Noncompliance 08/31/2015   Patient previously discharged from [facility] pain clinic secondary to missing appointments. 10/12/2015 - patient is taking more medication than prescribed. 12/22/2015 - opioid analgesic pain management treatment option terminated today due to noncompliance with medication policy and signed medication agreement. Patient tested positive for cannabinoids on the 11/24/2015 UDS. [facility]   Noncompliance with medication treatment due to overuse of medication 10/12/2015   Spondylosis    Substance use disorder Risk: HIGH 08/31/2015   Previously found to have a UDS positive for unreported use of ETOH  and cannabinoids on 12/07/2013.       Past Surgical History:  Procedure Laterality Date   RIGHT OOPHORECTOMY     SPINE SURGERY      Social History   Socioeconomic History   Marital status: Legally Separated    Spouse name: Not on file   Number of children: 2   Years of education: Not on file   Highest education level: 11th grade  Occupational History   Not on file  Tobacco Use   Smoking status: Every Day    Current packs/day: 0.50    Types: Cigarettes   Smokeless tobacco: Not on file  Vaping Use   Vaping status: Never Used  Substance and Sexual Activity   Alcohol use: Yes    Alcohol/week: 1.0 standard drink of alcohol    Types: 1 Shots of liquor per week    Comment: occasionally   Drug use: Not Currently    Types: Marijuana   Sexual activity: Not Currently  Other Topics Concern   Not on file  Social History Narrative   Left handed   Caffeine 6-8 cups daily   Homeless, lives in her car   Social Drivers of Health   Financial Resource Strain: Not on file  Food Insecurity: Food Insecurity Present (05/04/2024)   Hunger Vital Sign    Worried About Running Out of Food in the Last Year: Sometimes true    Ran Out of Food in the Last Year: Sometimes true  Transportation Needs: No Transportation Needs (05/04/2024)  PRAPARE - Administrator, Civil Service (Medical): No    Lack of Transportation (Non-Medical): No  Physical Activity: Not on file  Stress: Not on file  Social Connections: Not on file  Intimate Partner Violence: Not At Risk (05/04/2024)   Humiliation, Afraid, Rape, and Kick questionnaire    Fear of Current or Ex-Partner: No    Emotionally Abused: No    Physically Abused: No    Sexually Abused: No    Family History  Problem Relation Age of Onset   Depression Mother    Diabetes Mother    Cancer Mother    Stroke Mother    Hypertension Mother    Schizophrenia Sister    Depression Sister    Drug abuse Brother    Depression Brother    Drug  abuse Maternal Uncle    Drug abuse Paternal Uncle    Drug abuse Cousin     Allergies  Allergen Reactions   Duloxetine Other (See Comments)   Lisinopril  Cough   Cymbalta [Duloxetine Hcl] Other (See Comments)    hallucinations   Lyrica  [Pregabalin ] Other (See Comments)    Causes disorientation, confusion    Review of Systems  All other systems reviewed and are negative.      Objective:   BP 130/78   Pulse (!) 57   Ht 5' 7 (1.702 m)   Wt 165 lb 9.6 oz (75.1 kg)   SpO2 98%   BMI 25.94 kg/m   Vitals:   04/15/24 1420  BP: 130/78  Pulse: (!) 57  Height: 5' 7 (1.702 m)  Weight: 165 lb 9.6 oz (75.1 kg)  SpO2: 98%  BMI (Calculated): 25.93    Physical Exam Vitals and nursing note reviewed.  Constitutional:      Appearance: Normal appearance. She is normal weight.  HENT:     Head: Normocephalic and atraumatic.  Eyes:     Extraocular Movements: Extraocular movements intact.     Conjunctiva/sclera: Conjunctivae normal.     Pupils: Pupils are equal, round, and reactive to light.  Cardiovascular:     Rate and Rhythm: Normal rate and regular rhythm.  Pulmonary:     Effort: Pulmonary effort is normal.  Musculoskeletal:     Cervical back: Normal range of motion.  Neurological:     Mental Status: She is alert and oriented to person, place, and time. Mental status is at baseline.     Sensory: Sensory deficit present.     Motor: Weakness present.     Coordination: Coordination abnormal.     Gait: Gait abnormal.     Deep Tendon Reflexes: Reflexes abnormal.  Psychiatric:        Mood and Affect: Mood normal.        Behavior: Behavior normal.        Thought Content: Thought content normal.      No results found for any visits on 04/15/24.  Recent Results (from the past 2160 hours)  CMP14+EGFR     Status: Abnormal   Collection Time: 03/18/24  2:02 PM  Result Value Ref Range   Glucose 76 70 - 99 mg/dL   BUN 8 6 - 24 mg/dL   Creatinine, Ser 9.13 0.57 - 1.00 mg/dL    eGFR 79 >40 fO/fpw/8.26   BUN/Creatinine Ratio 9 9 - 23   Sodium 142 134 - 144 mmol/L   Potassium 4.2 3.5 - 5.2 mmol/L   Chloride 107 (H) 96 - 106 mmol/L   CO2 23 20 -  29 mmol/L   Calcium 9.2 8.7 - 10.2 mg/dL   Total Protein 6.7 6.0 - 8.5 g/dL   Albumin 4.4 3.8 - 4.9 g/dL   Globulin, Total 2.3 1.5 - 4.5 g/dL   Bilirubin Total <9.7 0.0 - 1.2 mg/dL   Alkaline Phosphatase 85 44 - 121 IU/L   AST 17 0 - 40 IU/L   ALT 11 0 - 32 IU/L  Lipid panel     Status: None   Collection Time: 03/18/24  2:02 PM  Result Value Ref Range   Cholesterol, Total 170 100 - 199 mg/dL   Triglycerides 98 0 - 149 mg/dL   HDL 58 >60 mg/dL   VLDL Cholesterol Cal 18 5 - 40 mg/dL   LDL Chol Calc (NIH) 94 0 - 99 mg/dL   Chol/HDL Ratio 2.9 0.0 - 4.4 ratio    Comment:                                   T. Chol/HDL Ratio                                             Men  Women                               1/2 Avg.Risk  3.4    3.3                                   Avg.Risk  5.0    4.4                                2X Avg.Risk  9.6    7.1                                3X Avg.Risk 23.4   11.0   VITAMIN D  25 Hydroxy (Vit-D Deficiency, Fractures)     Status: None   Collection Time: 03/18/24  2:02 PM  Result Value Ref Range   Vit D, 25-Hydroxy 37.5 30.0 - 100.0 ng/mL    Comment: Vitamin D  deficiency has been defined by the Institute of Medicine and an Endocrine Society practice guideline as a level of serum 25-OH vitamin D  less than 20 ng/mL (1,2). The Endocrine Society went on to further define vitamin D  insufficiency as a level between 21 and 29 ng/mL (2). 1. IOM (Institute of Medicine). 2010. Dietary reference    intakes for calcium and D. [place]: The    [employer]. 2. Holick MF, Binkley [place], Bischoff-Ferrari HA, et al.    Evaluation, treatment, and prevention of vitamin D     deficiency: an Endocrine Society clinical practice    guideline. JCEM. 2011 Jul; 96(7):1911-30.   Vitamin B12      Status: None   Collection Time: 03/18/24  2:02 PM  Result Value Ref Range   Vitamin B-12 529 232 - 1,245 pg/mL  CBC with Diff     Status: None   Collection Time: 03/18/24  2:02 PM  Result Value Ref Range   WBC  8.6 3.4 - 10.8 x10E3/uL   RBC 4.19 3.77 - 5.28 x10E6/uL   Hemoglobin 12.3 11.1 - 15.9 g/dL   Hematocrit 61.6 65.9 - 46.6 %   MCV 91 79 - 97 fL   MCH 29.4 26.6 - 33.0 pg   MCHC 32.1 31.5 - 35.7 g/dL   RDW 86.3 88.2 - 84.5 %   Platelets 253 150 - 450 x10E3/uL   Neutrophils 55 Not Estab. %   Lymphs 36 Not Estab. %   Monocytes 7 Not Estab. %   Eos 1 Not Estab. %   Basos 1 Not Estab. %   Neutrophils Absolute 4.8 1.4 - 7.0 x10E3/uL   Lymphocytes Absolute 3.1 0.7 - 3.1 x10E3/uL   Monocytes Absolute 0.6 0.1 - 0.9 x10E3/uL   EOS (ABSOLUTE) 0.1 0.0 - 0.4 x10E3/uL   Basophils Absolute 0.1 0.0 - 0.2 x10E3/uL   Immature Granulocytes 0 Not Estab. %   Immature Grans (Abs) 0.0 0.0 - 0.1 x10E3/uL  Hemoglobin A1c     Status: None   Collection Time: 03/18/24  2:02 PM  Result Value Ref Range   Hgb A1c MFr Bld 5.6 4.8 - 5.6 %    Comment:          Prediabetes: 5.7 - 6.4          Diabetes: >6.4          Glycemic control for adults with diabetes: <7.0    Est. 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  Will defer to them for further treatment changes.  Reassess at follow up.  Pulmonary emphysema, unspecified emphysema type (HCC) Patient stable.  Well controlled with current therapy.   Continue current meds.    Sending referral to [facility] as well, hopefully they will be able to help her get some additional resources to help her.   Return in about 1 month (around  05/15/2024) for F/U.   Total time spent: 20 minutes  ALAN CHRISTELLA ARRANT, FNP  04/15/2024  This document may have been prepared by [facility] Voice Recognition software and as such may include unintentional dictation errors.

## 2024-06-04 NOTE — Assessment & Plan Note (Signed)
 Setting patient up for referral to neurology at a different office.  Will defer to them for further treatment changes.  Reassess at follow up.

## 2024-06-04 NOTE — Assessment & Plan Note (Signed)
 Patient stable.  Well controlled with current therapy.   Continue current meds.

## 2024-06-08 DIAGNOSIS — F17210 Nicotine dependence, cigarettes, uncomplicated: Secondary | ICD-10-CM | POA: Diagnosis not present

## 2024-06-08 DIAGNOSIS — Z5941 Food insecurity: Secondary | ICD-10-CM | POA: Diagnosis not present

## 2024-06-08 DIAGNOSIS — M79644 Pain in right finger(s): Secondary | ICD-10-CM | POA: Diagnosis not present

## 2024-06-08 DIAGNOSIS — Z91018 Allergy to other foods: Secondary | ICD-10-CM | POA: Diagnosis not present

## 2024-06-08 DIAGNOSIS — M7989 Other specified soft tissue disorders: Secondary | ICD-10-CM | POA: Diagnosis not present

## 2024-06-08 DIAGNOSIS — Z1889 Other specified retained foreign body fragments: Secondary | ICD-10-CM | POA: Diagnosis not present

## 2024-06-08 DIAGNOSIS — W4904XA Ring or other jewelry causing external constriction, initial encounter: Secondary | ICD-10-CM | POA: Diagnosis not present

## 2024-06-18 ENCOUNTER — Other Ambulatory Visit: Payer: Self-pay

## 2024-06-18 NOTE — Patient Instructions (Signed)
 Visit Information  Thank you for taking time to visit with me today. Please don't hesitate to contact me if I can be of assistance to you before our next scheduled appointment.  Your next care management appointment is by telephone on 07/02/2024  at 3:00 PM   Please call the care guide team at [phone] if you need to cancel, schedule, or reschedule an appointment.   Please call the Suicide and Crisis Lifeline: 988 if you are experiencing a Mental Health or Behavioral Health Crisis or need someone to talk to.  Olam Ally, MSW, LCSW [place]  [facility], [facility] Licensed Clinical Social Worker Direct Dial: [phone]

## 2024-06-18 NOTE — Patient Outreach (Signed)
 Complex Care Management   Visit Note  06/18/2024  Name:  Diane Knapp MRN: [id number] DOB: [birth year]  Situation: Referral received for Complex Care Management related to Mental/Behavioral Health diagnosis anxiety and depression and SDOH Barriers:  Housing  and Food insecurity. I obtained verbal consent from Patient.  Visit completed with patient  on the phone. LCSW spoke with patient for scheduled visit.  Patient reports that she has not followed up with [facility] and was provided the contact information again. LCSW encouraged patient to follow up with agency to schedule appointment, Background:   Past Medical History:  Diagnosis Date   Anxiety    Callus of foot    right bottom of foot- removed 11/24/15   Chronic cervical radicular pain (Right) 10/12/2015   COPD (chronic obstructive pulmonary disease) (HCC)    Depression    Encounter for long-term (current) use of medications 08/31/2015   Encounter for therapeutic drug level monitoring 11/24/2015   Hypertension    Hypomagnesemia 10/27/2015   Marijuana use (see 11/24/2015 UDS) 08/31/2015   Myelomalacia (HCC)    Noncompliance 08/31/2015   Patient previously discharged from [facility] pain clinic secondary to missing appointments. 10/12/2015 - patient is taking more medication than prescribed. 12/22/2015 - opioid analgesic pain management treatment option terminated today due to noncompliance with medication policy and signed medication agreement. Patient tested positive for cannabinoids on the 11/24/2015 UDS. [facility]   Noncompliance with medication treatment due to overuse of medication 10/12/2015   Spondylosis    Substance use disorder Risk: HIGH 08/31/2015   Previously found to have a UDS positive for unreported use of ETOH and cannabinoids on 12/07/2013.       Assessment: Patient Reported Symptoms:  Cognitive Cognitive Status: Normal speech and language skills, Alert and oriented to person, place, and  time Cognitive/Intellectual Conditions Management [RPT]: None reported or documented in medical history or problem list      Neurological Neurological Review of Symptoms: Headaches (Patient reports waking up with headaches, Patient reports that she will alert PCP) Neurological Management Strategies: Adequate rest  HEENT HEENT Symptoms Reported: No symptoms reported      Cardiovascular Cardiovascular Symptoms Reported: No symptoms reported Does patient have uncontrolled Hypertension?: No Is patient checking Blood Pressure at home?: Yes Patient's Recent BP reading at home: Patient reports readings that are up and down. Patient reports no real high readings Cardiovascular Management Strategies: Medication therapy  Respiratory Respiratory Symptoms Reported: No symptoms reported    Endocrine Endocrine Symptoms Reported: No symptoms reported Is patient diabetic?: No    Gastrointestinal Gastrointestinal Symptoms Reported: Other Other Gastrointestinal Symptoms: Patient reports still a weak bladder Gastrointestinal Management Strategies: Adequate rest, Incontinence garment/pad    Genitourinary Genitourinary Symptoms Reported: No symptoms reported, Incontinence Genitourinary Management Strategies: Incontinence garment/pad  Integumentary Integumentary Symptoms Reported: No symptoms reported    Musculoskeletal Musculoskelatal Symptoms Reviewed: Other Additional Musculoskeletal Details: slow walking and feet swelling Musculoskeletal Management Strategies: Medical device, Routine screening      Psychosocial Psychosocial Symptoms Reported: Anxiety - if selected complete GAD, Depression - if selected complete PHQ 2-9 Additional Psychological Details: Patient reports feeling anxiety due to husband being sick Behavioral Management Strategies: Medication therapy, Coping strategies Major Change/Loss/Stressor/Fears (CP): Medical condition, self Techniques to Cope with Loss/Stress/Change: Medication,  Diversional activities Quality of Family Relationships: stressful, supportive Do you feel physically threatened by others?: No    06/18/2024    PHQ2-9 Depression Screening   Little interest or pleasure in doing things  Feeling down, depressed, or hopeless    PHQ-2 - Total Score    Trouble falling or staying asleep, or sleeping too much    Feeling tired or having little energy    Poor appetite or overeating     Feeling bad about yourself - or that you are a failure or have let yourself or your family down    Trouble concentrating on things, such as reading the newspaper or watching television    Moving or speaking so slowly that other people could have noticed.  Or the opposite - being so fidgety or restless that you have been moving around a lot more than usual    Thoughts that you would be better off dead, or hurting yourself in some way    PHQ2-9 Total Score    If you checked off any problems, how difficult have these problems made it for you to do your work, take care of things at home, or get along with other people    Depression Interventions/Treatment      There were no vitals filed for this visit.  Medications Reviewed Today     Reviewed by Sherren Olam FORBES KEN (Social Worker) on 06/18/24 at 1451  Med List Status: <None>   Medication Order Taking? Sig Documenting Provider Last Dose Status Informant  amLODipine  (NORVASC ) 5 MG tablet [id number]  Take 1 tablet by mouth once daily [name], FNP  Active   aspirin  EC 81 MG tablet [id number]  Take 1 tablet (81 mg total) by mouth daily. Swallow whole. Fernand Denyse LABOR, MD  Active   Cholecalciferol (VITAMIN D3) 2000 units capsule [id number]  Take 1 capsule (2,000 Units total) by mouth daily. Tanya Glisson, MD  Active            Med Note CARMIN, [place]   Wed Mar 18, 2024  1:18 PM)    FLUoxetine  HCl 60 MG TABS [id number]  Take 60 mg by mouth daily with breakfast. Stop Fluoxetine  40 mg Eappen, Saramma, MD  Active   isosorbide   mononitrate (IMDUR ) 30 MG 24 hr tablet [id number]  Take 1 tablet by mouth once daily Scoggins, Amber, NP  Active   losartan  (COZAAR ) 50 MG tablet [id number]  Take 1 tablet (50 mg total) by mouth daily. [name], FNP  Active   OLANZapine  (ZYPREXA ) 7.5 MG tablet [id number]  Take 1 tablet (7.5 mg total) by mouth at bedtime. Dose increase Eappen, Saramma, MD  Active   predniSONE  (DELTASONE ) 20 MG tablet [id number]  Take 2 tablets (40 mg total) by mouth daily with breakfast. [name], FNP  Active   pregabalin  (LYRICA ) 100 MG capsule [id number]  Take 1 capsule (100 mg total) by mouth 2 (two) times daily.  Patient not taking: Reported on 05/26/2024   [name], FNP  Active   propranolol  (INDERAL ) 20 MG tablet [id number]  Take 1 tablet (20 mg total) by mouth 3 (three) times daily. [name], FNP  Active   varenicline  (CHANTIX ) 0.5 MG tablet [id number]  Take 1 tablet (0.5 mg total) by mouth daily. Eappen, Saramma, MD  Active   Vitamin D , Ergocalciferol , (DRISDOL ) 1.25 MG (50000 UNIT) CAPS capsule [id number]  Take 1 capsule (50,000 Units total) by mouth every 7 (seven) days. [name], FNP  Active   Med List Note Elwanda Olam FORBES KEN 05/04/24 1511): Patient is currently driving, will review medications at next visit.            Recommendation:  Continue Current Plan of Care  Follow Up Plan:   Telephone follow-up 07/02/2024 at 3:00 PM  Olam Ally, MSW, LCSW [place]  [facility], [facility] Licensed Clinical Social Worker Direct Dial: [phone]

## 2024-07-01 ENCOUNTER — Telehealth: Payer: Self-pay

## 2024-07-01 NOTE — Progress Notes (Signed)
 Complex Care Management Care Guide Note  07/01/2024 Name: Diane Knapp MRN: [id number] DOB: 05-21-1966  Diane Knapp is a 58 y.o. year old female who is a primary care patient of [name], FNP and is actively engaged with the care management team. I reached out to [name] by phone today to assist with re-scheduling  with the Licensed Clinical Child psychotherapist.  Follow up plan: Unsuccessful telephone outreach attempt made. A HIPAA compliant phone message was left for the patient providing contact information and requesting a return call.  [name] [facility], [facility] Guide  Direct Dial: [phone]  Fax [phone]

## 2024-07-02 ENCOUNTER — Telehealth

## 2024-07-02 ENCOUNTER — Encounter

## 2024-07-02 DIAGNOSIS — G4730 Sleep apnea, unspecified: Secondary | ICD-10-CM | POA: Diagnosis not present

## 2024-07-02 DIAGNOSIS — G4733 Obstructive sleep apnea (adult) (pediatric): Secondary | ICD-10-CM

## 2024-07-15 DIAGNOSIS — G4733 Obstructive sleep apnea (adult) (pediatric): Secondary | ICD-10-CM | POA: Diagnosis not present

## 2024-07-16 ENCOUNTER — Encounter: Payer: Self-pay | Admitting: Family

## 2024-07-16 ENCOUNTER — Ambulatory Visit: Payer: Self-pay

## 2024-07-16 ENCOUNTER — Ambulatory Visit: Admitting: Family

## 2024-07-16 VITALS — BP 150/86 | HR 79 | Ht 67.0 in | Wt 165.6 lb

## 2024-07-16 DIAGNOSIS — J439 Emphysema, unspecified: Secondary | ICD-10-CM | POA: Diagnosis not present

## 2024-07-16 DIAGNOSIS — Z79891 Long term (current) use of opiate analgesic: Secondary | ICD-10-CM

## 2024-07-16 DIAGNOSIS — G379 Demyelinating disease of central nervous system, unspecified: Secondary | ICD-10-CM

## 2024-07-16 DIAGNOSIS — I10 Essential (primary) hypertension: Secondary | ICD-10-CM | POA: Diagnosis not present

## 2024-07-16 DIAGNOSIS — F411 Generalized anxiety disorder: Secondary | ICD-10-CM

## 2024-07-16 DIAGNOSIS — L853 Xerosis cutis: Secondary | ICD-10-CM

## 2024-07-16 DIAGNOSIS — F4310 Post-traumatic stress disorder, unspecified: Secondary | ICD-10-CM

## 2024-07-16 DIAGNOSIS — G9589 Other specified diseases of spinal cord: Secondary | ICD-10-CM

## 2024-07-16 DIAGNOSIS — M792 Neuralgia and neuritis, unspecified: Secondary | ICD-10-CM | POA: Diagnosis not present

## 2024-07-16 DIAGNOSIS — F331 Major depressive disorder, recurrent, moderate: Secondary | ICD-10-CM

## 2024-07-16 MED ORDER — AMLODIPINE BESYLATE 10 MG PO TABS: 10.0000 mg | ORAL_TABLET | Freq: Every day | ORAL | 11 refills | Status: AC

## 2024-07-16 NOTE — Patient Instructions (Addendum)
 Amlodipine  - increase your dose to 10mg , will send new prescription, but you can take 2 of the ones you have (5 mg tablets) until you are out.

## 2024-07-16 NOTE — Assessment & Plan Note (Signed)
 Patient is seen by Psychiatry, who manage these conditions.  She is well controlled with current therapy.   Will defer to them for further changes to plan of care.

## 2024-07-16 NOTE — Assessment & Plan Note (Signed)
 Setting patient up for referral to Palliative Care.  Will defer to them for further treatment changes.  Reassess at follow up.

## 2024-07-16 NOTE — Assessment & Plan Note (Signed)
 Patient is seen by neurology, who manage this condition.  She has an appointment with [facility] on 10/09/2024.   She is well controlled with current therapy.   Will defer to them for further changes to plan of care.

## 2024-07-16 NOTE — Assessment & Plan Note (Signed)
 Increase amlodipine  to 10 mg.  Will reassess at follow up appt.

## 2024-07-16 NOTE — Progress Notes (Signed)
 Established Patient Office Visit  Subjective:  Patient ID: Diane Knapp, female    DOB: [birth year]  Age: 58 y.o. MRN: [id number]  Chief Complaint  Patient presents with   Follow-up    2 month follow up    Patient is here today for her 2 months follow up.  She has been feeling poorly since last appointment.   She does have additional concerns to discuss today.  She has been having headaches frequently, and says that her blood pressures have been elevated at other provider appointments as well.  She also reports that she needs a new referral to dermatology.  Finally, asks if we can get her set up for a referral to palliative medicine to help manage her disease state and see if she can get set up for pain mgmt.   Labs are not due today.  She needs refills.   I have reviewed her active problem list, medication list, allergies, notes from last encounter, lab results for her appointment today.      No other concerns at this time.   Past Medical History:  Diagnosis Date   Anxiety    Callus of foot    right bottom of foot- removed 11/24/15   Chronic cervical radicular pain (Right) 10/12/2015   COPD (chronic obstructive pulmonary disease) (HCC)    Depression    Encounter for long-term (current) use of medications 08/31/2015   Encounter for therapeutic drug level monitoring 11/24/2015   Hypertension    Hypomagnesemia 10/27/2015   Marijuana use (see 11/24/2015 UDS) 08/31/2015   Myelomalacia (HCC)    Noncompliance 08/31/2015   Patient previously discharged from [facility] pain clinic secondary to missing appointments. 10/12/2015 - patient is taking more medication than prescribed. 12/22/2015 - opioid analgesic pain management treatment option terminated today due to noncompliance with medication policy and signed medication agreement. Patient tested positive for cannabinoids on the 11/24/2015 UDS. [facility]   Noncompliance with medication treatment due to overuse of  medication 10/12/2015   Spondylosis    Substance use disorder Risk: HIGH 08/31/2015   Previously found to have a UDS positive for unreported use of ETOH and cannabinoids on 12/07/2013.       Past Surgical History:  Procedure Laterality Date   RIGHT OOPHORECTOMY     SPINE SURGERY      Social History   Socioeconomic History   Marital status: Legally Separated    Spouse name: Not on file   Number of children: 2   Years of education: Not on file   Highest education level: 11th grade  Occupational History   Not on file  Tobacco Use   Smoking status: Every Day    Current packs/day: 0.50    Types: Cigarettes   Smokeless tobacco: Not on file  Vaping Use   Vaping status: Never Used  Substance and Sexual Activity   Alcohol use: Yes    Alcohol/week: 1.0 standard drink of alcohol    Types: 1 Shots of liquor per week    Comment: occasionally   Drug use: Not Currently    Types: Marijuana   Sexual activity: Not Currently  Other Topics Concern   Not on file  Social History Narrative   Left handed   Caffeine 6-8 cups daily   Homeless, lives in her car   Social Drivers of Health   Financial Resource Strain: Not on file  Food Insecurity: Food Insecurity Present (05/04/2024)   Hunger Vital Sign    Worried About Running  Out of Food in the Last Year: Sometimes true    Ran Out of Food in the Last Year: Sometimes true  Transportation Needs: No Transportation Needs (05/04/2024)   PRAPARE - Administrator, Civil Service (Medical): No    Lack of Transportation (Non-Medical): No  Physical Activity: Not on file  Stress: Not on file  Social Connections: Not on file  Intimate Partner Violence: Not At Risk (05/04/2024)   Humiliation, Afraid, Rape, and Kick questionnaire    Fear of Current or Ex-Partner: No    Emotionally Abused: No    Physically Abused: No    Sexually Abused: No    Family History  Problem Relation Age of Onset   Depression Mother    Diabetes Mother     Cancer Mother    Stroke Mother    Hypertension Mother    Schizophrenia Sister    Depression Sister    Drug abuse Brother    Depression Brother    Drug abuse Maternal Uncle    Drug abuse Paternal Uncle    Drug abuse Cousin     Allergies  Allergen Reactions   Duloxetine Other (See Comments)   Lisinopril  Cough   Cymbalta [Duloxetine Hcl] Other (See Comments)    hallucinations   Lyrica  [Pregabalin ] Other (See Comments)    Causes disorientation, confusion    Review of Systems  All other systems reviewed and are negative.      Objective:   BP (!) 150/86   Pulse 79   Ht 5' 7 (1.702 m)   Wt 165 lb 9.6 oz (75.1 kg)   SpO2 98%   BMI 25.94 kg/m   Vitals:   07/16/24 1141  BP: (!) 150/86  Pulse: 79  Height: 5' 7 (1.702 m)  Weight: 165 lb 9.6 oz (75.1 kg)  SpO2: 98%  BMI (Calculated): 25.93    Physical Exam Vitals and nursing note reviewed.  Constitutional:      Appearance: Normal appearance. She is normal weight.  HENT:     Head: Normocephalic.  Eyes:     Extraocular Movements: Extraocular movements intact.     Conjunctiva/sclera: Conjunctivae normal.     Pupils: Pupils are equal, round, and reactive to light.  Cardiovascular:     Rate and Rhythm: Normal rate.  Pulmonary:     Effort: Pulmonary effort is normal.  Neurological:     General: No focal deficit present.     Mental Status: She is alert and oriented to person, place, and time. Mental status is at baseline.  Psychiatric:        Mood and Affect: Mood normal.        Behavior: Behavior normal.        Thought Content: Thought content normal.      No results found for any visits on 07/16/24.  No results found for this or any previous visit (from the past 2160 hours).     Assessment & Plan Dry skin dermatitis Setting patient up for referral to dermatology .  Will defer to them for further treatment changes.  Reassess at follow up.  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  Will defer to them for further treatment changes.  Reassess at follow up.  Demyelinating disease (HCC) Patient is seen by neurology, who manage this condition.  She has an appointment with [facility] on 10/09/2024.   She is well controlled with  current therapy.   Will defer to them for further changes to plan of care.  Pulmonary emphysema, unspecified emphysema type Patient is seen by pulmonary, who manage this condition.  She is well controlled with current therapy.   Will defer to them for further changes to plan of care.  Essential hypertension, benign Increase amlodipine  to 10 mg.  Will reassess at follow up appt.   MDD (major depressive disorder), recurrent episode, moderate (HCC) PTSD (post-traumatic stress disorder) GAD (generalized anxiety disorder) Patient is seen by Psychiatry, who manage these conditions.  She is well controlled with current therapy.   Will defer to them for further changes to plan of care.     Return in about 2 weeks (around 07/30/2024).   Total time spent: 30 minutes  ALAN CHRISTELLA ARRANT, FNP  07/16/2024   This document may have been prepared by [facility] Voice Recognition software and as such may include unintentional dictation errors.

## 2024-07-16 NOTE — Assessment & Plan Note (Signed)
 Patient is seen by pulmonary, who manage this condition.  She is well controlled with current therapy.   Will defer to them for further changes to plan of care.

## 2024-07-22 ENCOUNTER — Other Ambulatory Visit: Payer: Self-pay

## 2024-07-22 NOTE — Patient Outreach (Signed)
 LCSw called patient for visit and was unable to reach patient. Patient's mailbox is full therefore LCSW could not leave voice mail. LCSW scheduled another appointment for 07/31/2024 at 10:00 am. This is the 1st unsuccessful attempt.  Olam Ally, MSW, LCSW [place]  [facility], [facility] Licensed Clinical Social Worker Direct Dial: [phone]

## 2024-07-22 NOTE — Patient Instructions (Signed)
 Visit Information  Diane Knapp - I am sorry I was unable to reach you today for our scheduled appointment. I work with [name], FNP and am calling to support your healthcare needs. Please contact me at [phone] at your earliest convenience. I look forward to speaking with you soon.    Your next care management appointment is by telephone on 07/31/2024 at 10:00 am   Please call the care guide team at [phone] if you need to cancel, schedule, or reschedule an appointment.   Please call the Suicide and Crisis Lifeline: 988 if you are experiencing a Mental Health or Behavioral Health Crisis or need someone to talk to.  Olam Ally, MSW, LCSW [place]  [facility], [facility] Health Licensed Clinical Social Worker Direct Dial: [phone]

## 2024-07-30 NOTE — Patient Instructions (Signed)
 Visit Information  Thank you for taking time to visit with me today. Please don't hesitate to contact me if I can be of assistance to you before our next scheduled appointment.  Our next appointment is by telephone on 08/14/24 at 3 pm Please call the care guide team at [phone] if you need to cancel or reschedule your appointment.   Following is a copy of your care plan:   Goals Addressed             This Visit's Progress    dexrease swelling in feet VBCI RN Care Plan   No change    Problems:  Chronic Disease Management support and education needs related to swelling of feet   Goal: Over the next 6 months the Patient will continue to work with RN Care Manager and/or Social Worker to address care management and care coordination needs related to HTN and swelling of feet PTSD as evidenced by adherence to care management team scheduled appointments       Interventions:   Hypertension Interventions: Last practice recorded BP readings:  BP Readings from Last 3 Encounters:  07/16/24 (!) 150/86  05/26/24 120/80  05/15/24 138/82   Most recent eGFR/CrCl:  Lab Results  Component Value Date   EGFR 79 03/18/2024    No components found for: CRCL  Evaluation of current treatment plan related to hypertension self management and patient's adherence to plan as established by provider Reviewed medications with patient and discussed importance of compliance Discussed plans with patient for ongoing care management follow up and provided patient with direct contact information for care management team Discussed complications of poorly controlled blood pressure such as heart disease, stroke, circulatory complications, vision complications, kidney impairment, sexual dysfunction Screening for signs and symptoms of depression related to chronic disease state  Encouragment offered Noted SW is already involved Discussed elevating the feet, wearing compression hose, resting   Patient  Self-Care Activities:  Attend all scheduled provider appointments Call pharmacy for medication refills 3-7 days in advance of running out of medications Take medications as prescribed    Plan:  Telephone follow up appointment with care management team member scheduled for:  Hendricks Her 08/14/24 3 pm              Please call the Suicide and Crisis Lifeline: 988 call the [place]  National Suicide Prevention Lifeline: 778-598-2065 or TTY: (272)148-4268 TTY 734-674-2137) to talk to a trained counselor call 1-800-273-TALK (toll free, 24 hour hotline) call 911 if you are experiencing a Mental Health or Behavioral Health Crisis or need someone to talk to.  Patient verbalizes understanding of instructions and care plan provided today and agrees to view in MyChart. Active MyChart status and patient understanding of how to access instructions and care plan via MyChart confirmed with patient.     Novia Lansberry L. Ramonita, RN, BSN, CCM [place]  [facility], [facility] Health RN Care Manager Direct Dial: [phone]  Fax: [phone]

## 2024-07-31 ENCOUNTER — Telehealth

## 2024-08-03 ENCOUNTER — Ambulatory Visit (INDEPENDENT_AMBULATORY_CARE_PROVIDER_SITE_OTHER): Admitting: Family

## 2024-08-03 ENCOUNTER — Encounter: Payer: Self-pay | Admitting: Family

## 2024-08-03 VITALS — BP 142/74 | HR 74 | Ht 67.0 in | Wt 163.4 lb

## 2024-08-03 DIAGNOSIS — E782 Mixed hyperlipidemia: Secondary | ICD-10-CM

## 2024-08-03 DIAGNOSIS — E538 Deficiency of other specified B group vitamins: Secondary | ICD-10-CM | POA: Diagnosis not present

## 2024-08-03 DIAGNOSIS — G894 Chronic pain syndrome: Secondary | ICD-10-CM | POA: Diagnosis not present

## 2024-08-03 DIAGNOSIS — G9589 Other specified diseases of spinal cord: Secondary | ICD-10-CM

## 2024-08-03 DIAGNOSIS — M797 Fibromyalgia: Secondary | ICD-10-CM

## 2024-08-03 DIAGNOSIS — R7303 Prediabetes: Secondary | ICD-10-CM

## 2024-08-03 DIAGNOSIS — E559 Vitamin D deficiency, unspecified: Secondary | ICD-10-CM

## 2024-08-03 DIAGNOSIS — M792 Neuralgia and neuritis, unspecified: Secondary | ICD-10-CM | POA: Diagnosis not present

## 2024-08-03 DIAGNOSIS — R5383 Other fatigue: Secondary | ICD-10-CM | POA: Diagnosis not present

## 2024-08-03 DIAGNOSIS — I10 Essential (primary) hypertension: Secondary | ICD-10-CM | POA: Diagnosis not present

## 2024-08-03 DIAGNOSIS — G8929 Other chronic pain: Secondary | ICD-10-CM

## 2024-08-03 DIAGNOSIS — M545 Low back pain, unspecified: Secondary | ICD-10-CM

## 2024-08-03 DIAGNOSIS — F331 Major depressive disorder, recurrent, moderate: Secondary | ICD-10-CM

## 2024-08-03 NOTE — Progress Notes (Signed)
 Established Patient Office Visit  Subjective:  Patient ID: Diane Knapp, female    DOB: [birth year]  Age: 58 y.o. MRN: [id number]  Chief Complaint  Patient presents with   Follow-up    2 week follow up    Patient is here today for her 2 week follow up.  She has been feeling fairly well since last appointment.   She does not have additional concerns to discuss today.  Needs referrals to pain mgmt and to neurosurgery d/t her low back pain.   Labs are due today.  She needs refills.   I have reviewed her active problem list, medication list, allergies, health maintenance, notes from last encounter, lab results for her appointment today.      No other concerns at this time.   Past Medical History:  Diagnosis Date   Anxiety    Callus of foot    right bottom of foot- removed 11/24/15   Chronic cervical radicular pain (Right) 10/12/2015   COPD (chronic obstructive pulmonary disease) (HCC)    Depression    Encounter for long-term (current) use of medications 08/31/2015   Encounter for therapeutic drug level monitoring 11/24/2015   Hypertension    Hypomagnesemia 10/27/2015   Marijuana use (see 11/24/2015 UDS) 08/31/2015   Myelomalacia (HCC)    Noncompliance 08/31/2015   Patient previously discharged from [facility] clinic secondary to missing appointments. 10/12/2015 - patient is taking more medication than prescribed. 12/22/2015 - opioid analgesic pain management treatment option terminated today due to noncompliance with medication policy and signed medication agreement. Patient tested positive for cannabinoids on the 11/24/2015 UDS. [facility]   Noncompliance with medication treatment due to overuse of medication 10/12/2015   Spondylosis    Substance use disorder Risk: HIGH 08/31/2015   Previously found to have a UDS positive for unreported use of ETOH and cannabinoids on 12/07/2013.       Past Surgical History:  Procedure Laterality Date   RIGHT  OOPHORECTOMY     SPINE SURGERY      Social History   Socioeconomic History   Marital status: Legally Separated    Spouse name: Not on file   Number of children: 2   Years of education: Not on file   Highest education level: 11th grade  Occupational History   Not on file  Tobacco Use   Smoking status: Every Day    Current packs/day: 0.50    Types: Cigarettes   Smokeless tobacco: Not on file  Vaping Use   Vaping status: Never Used  Substance and Sexual Activity   Alcohol use: Yes    Alcohol/week: 1.0 standard drink of alcohol    Types: 1 Shots of liquor per week    Comment: occasionally   Drug use: Not Currently    Types: Marijuana   Sexual activity: Not Currently  Other Topics Concern   Not on file  Social History Narrative   Left handed   Caffeine 6-8 cups daily   Homeless, lives in her car   Social Drivers of Health   Financial Resource Strain: Not on file  Food Insecurity: Food Insecurity Present (05/04/2024)   Hunger Vital Sign    Worried About Running Out of Food in the Last Year: Sometimes true    Ran Out of Food in the Last Year: Sometimes true  Transportation Needs: No Transportation Needs (05/04/2024)   PRAPARE - Administrator, Civil Service (Medical): No    Lack of Transportation (Non-Medical): No  Physical Activity: Not on file  Stress: Not on file  Social Connections: Not on file  Intimate Partner Violence: Not At Risk (05/04/2024)   Humiliation, Afraid, Rape, and Kick questionnaire    Fear of Current or Ex-Partner: No    Emotionally Abused: No    Physically Abused: No    Sexually Abused: No    Family History  Problem Relation Age of Onset   Depression Mother    Diabetes Mother    Cancer Mother    Stroke Mother    Hypertension Mother    Schizophrenia Sister    Depression Sister    Drug abuse Brother    Depression Brother    Drug abuse Maternal Uncle    Drug abuse Paternal Uncle    Drug abuse Cousin     Allergies  Allergen  Reactions   Duloxetine Other (See Comments)   Lisinopril  Cough   Cymbalta [Duloxetine Hcl] Other (See Comments)    hallucinations   Lyrica  [Pregabalin ] Other (See Comments)    Causes disorientation, confusion    Review of Systems  All other systems reviewed and are negative.      Objective:   BP (!) 142/74   Pulse 74   Ht 5' 7 (1.702 m)   Wt 163 lb 6.4 oz (74.1 kg)   SpO2 98%   BMI 25.59 kg/m   Vitals:   08/03/24 1140  BP: (!) 142/74  Pulse: 74  Height: 5' 7 (1.702 m)  Weight: 163 lb 6.4 oz (74.1 kg)  SpO2: 98%  BMI (Calculated): 25.59    Physical Exam Vitals and nursing note reviewed.  Constitutional:      Appearance: Normal appearance. She is normal weight.  HENT:     Head: Normocephalic.  Eyes:     Extraocular Movements: Extraocular movements intact.     Conjunctiva/sclera: Conjunctivae normal.     Pupils: Pupils are equal, round, and reactive to light.  Cardiovascular:     Rate and Rhythm: Normal rate.  Pulmonary:     Effort: Pulmonary effort is normal.  Musculoskeletal:     Cervical back: Normal range of motion.  Neurological:     General: No focal deficit present.     Mental Status: She is alert and oriented to person, place, and time. Mental status is at baseline.  Psychiatric:        Mood and Affect: Mood normal.        Behavior: Behavior normal.        Thought Content: Thought content normal.        Judgment: Judgment normal.      No results found for any visits on 08/03/24.  No results found for this or any previous visit (from the past 2160 hours).     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  Will defer to them for further treatment changes.  Reassess at follow up.  Mixed hyperlipidemia Checking labs today.  Continue current therapy for  lipid control. Will modify as needed based on labwork results.   -CMP w/eGFR -Lipid Panel  Essential hypertension, benign BP elevated today - pt having severe pain and has not taken her medications yet.   Prediabetes A1C Continues to be in prediabetic ranges.  Will reassess at follow up after next lab check.  Patient counseled on dietary choices and verbalized understanding.   -CBC  w/Diff -CMP w/eGFR -Hemoglobin A1C  Vitamin D  deficiency, unspecified B12 deficiency due to diet Other fatigue Checking labs today.  Will continue supplements as needed.   - Vitamin D  - Vitamin B12 - TSH  Major depressive disorder, recurrent episode, moderate (HCC) Patient stable.  Well controlled with current therapy.   Continue current meds.   Hypomagnesemia Magnesium  level checked today.  Will call with results when available.     Return in about 2 months (around 10/03/2024).   Total time spent: 20 minutes  ALAN CHRISTELLA ARRANT, FNP  08/03/2024   This document may have been prepared by [facility] Voice Recognition software and as such may include unintentional dictation errors.

## 2024-08-04 LAB — CBC WITH DIFFERENTIAL/PLATELET
Basophils Absolute: 0.1 x10E3/uL (ref 0.0–0.2)
Basos: 1 %
EOS (ABSOLUTE): 0.1 x10E3/uL (ref 0.0–0.4)
Eos: 1 %
Hematocrit: 39.7 % (ref 34.0–46.6)
Hemoglobin: 12.8 g/dL (ref 11.1–15.9)
Immature Grans (Abs): 0 x10E3/uL (ref 0.0–0.1)
Immature Granulocytes: 0 %
Lymphocytes Absolute: 3.6 x10E3/uL — ABNORMAL HIGH (ref 0.7–3.1)
Lymphs: 35 %
MCH: 29.3 pg (ref 26.6–33.0)
MCHC: 32.2 g/dL (ref 31.5–35.7)
MCV: 91 fL (ref 79–97)
Monocytes Absolute: 0.7 x10E3/uL (ref 0.1–0.9)
Monocytes: 7 %
Neutrophils Absolute: 5.7 x10E3/uL (ref 1.4–7.0)
Neutrophils: 56 %
Platelets: 270 x10E3/uL (ref 150–450)
RBC: 4.37 x10E6/uL (ref 3.77–5.28)
RDW: 13.6 % (ref 11.7–15.4)
WBC: 10 x10E3/uL (ref 3.4–10.8)

## 2024-08-04 LAB — CMP14+EGFR
ALT: 15 IU/L (ref 0–32)
AST: 21 IU/L (ref 0–40)
Albumin: 4.4 g/dL (ref 3.8–4.9)
Alkaline Phosphatase: 91 IU/L (ref 49–135)
BUN/Creatinine Ratio: 8 — ABNORMAL LOW (ref 9–23)
BUN: 8 mg/dL (ref 6–24)
Bilirubin Total: 0.3 mg/dL (ref 0.0–1.2)
CO2: 22 mmol/L (ref 20–29)
Calcium: 9.5 mg/dL (ref 8.7–10.2)
Chloride: 102 mmol/L (ref 96–106)
Creatinine, Ser: 0.95 mg/dL (ref 0.57–1.00)
Globulin, Total: 2.5 g/dL (ref 1.5–4.5)
Glucose: 95 mg/dL (ref 70–99)
Potassium: 3.9 mmol/L (ref 3.5–5.2)
Sodium: 140 mmol/L (ref 134–144)
Total Protein: 6.9 g/dL (ref 6.0–8.5)
eGFR: 70 mL/min/1.73 (ref >59)

## 2024-08-04 LAB — LIPID PANEL
Chol/HDL Ratio: 3.4 ratio (ref 0.0–4.4)
Cholesterol, Total: 213 mg/dL — ABNORMAL HIGH (ref 100–199)
HDL: 62 mg/dL (ref >39)
LDL Chol Calc (NIH): 131 mg/dL — ABNORMAL HIGH (ref 0–99)
Triglycerides: 115 mg/dL (ref 0–149)
VLDL Cholesterol Cal: 20 mg/dL (ref 5–40)

## 2024-08-04 LAB — HEMOGLOBIN A1C
Est. average glucose Bld gHb Est-mCnc: 117 mg/dL
Hgb A1c MFr Bld: 5.7 % — ABNORMAL HIGH (ref 4.8–5.6)

## 2024-08-04 LAB — VITAMIN D 25 HYDROXY (VIT D DEFICIENCY, FRACTURES): Vit D, 25-Hydroxy: 36.1 ng/mL (ref 30.0–100.0)

## 2024-08-04 LAB — VITAMIN B12: Vitamin B-12: 590 pg/mL (ref 232–1245)

## 2024-08-04 LAB — TSH: TSH: 0.623 u[IU]/mL (ref 0.450–4.500)

## 2024-08-04 LAB — MAGNESIUM: Magnesium: 2 mg/dL (ref 1.6–2.3)

## 2024-08-06 ENCOUNTER — Other Ambulatory Visit: Payer: Self-pay

## 2024-08-06 NOTE — Patient Instructions (Signed)
 Visit Information  Ms. Brunelli was given information about Medicaid Managed Care team care coordination services as a part of their [facility] Community Plan Medicaid benefit.   If you would like to schedule transportation through your [facility], please call the following number at least 2 days in advance of your appointment: [phone]   Rides for urgent appointments can also be made after hours by calling Member Services.  Call the Behavioral Health Crisis Line at [phone], at any time, 24 hours a day, 7 days a week. If you are in danger or need immediate medical attention call 911.  Please see education materials related to COPD  provided by MyChart link.  Patient verbalizes understanding of instructions and care plan provided today and agrees to view in MyChart. Active MyChart status and patient understanding of how to access instructions and care plan via MyChart confirmed with patient.     Telephone follow up appointment with Managed Medicaid care management team member scheduled for:08-21-2024 at 1:00 PM   Hendricks Her RN, BSN  [place] VBCI-Population Health RN Case Manager   Direct [phone]   Following is a copy of your plan of care:  There are no care plans that you recently modified to display for this patient.

## 2024-08-15 ENCOUNTER — Encounter: Payer: Self-pay | Admitting: Family

## 2024-08-15 NOTE — Assessment & Plan Note (Signed)
 BP elevated today - pt having severe pain and has not taken her medications yet.

## 2024-08-15 NOTE — Assessment & Plan Note (Signed)
 Setting patient up for referral to pain medicine and neurosurgery.  Will defer to them for further treatment changes.  Reassess at follow up.

## 2024-08-15 NOTE — Assessment & Plan Note (Signed)
 Checking labs today.  Will continue supplements as needed.   - Vitamin D  - Vitamin B12 - TSH

## 2024-08-19 ENCOUNTER — Telehealth: Payer: Self-pay | Admitting: Licensed Clinical Social Worker

## 2024-08-19 ENCOUNTER — Telehealth

## 2024-08-19 ENCOUNTER — Encounter: Payer: Self-pay | Admitting: Licensed Clinical Social Worker

## 2024-08-19 NOTE — Patient Instructions (Signed)
 Diane Knapp Ned - I am sorry I was unable to reach you today for our scheduled appointment. I work with [name], FNP and am calling to support your healthcare needs. Please contact me at [phone] at your earliest convenience. I look forward to speaking with you soon.   Thank you,  Cena Ligas, LCSW Clinical Social Worker VBCI Population Health

## 2024-08-21 ENCOUNTER — Telehealth: Payer: Self-pay

## 2024-08-21 NOTE — Patient Instructions (Signed)
 Diane Knapp - I am sorry I was unable to reach you today for our scheduled appointment. I work with [name], FNP and am calling to support your healthcare needs. Please contact me at [phone] at your earliest convenience. I look forward to speaking with you soon.   Thank you,  Hendricks Her RN, BSN  [place] VBCI-Population Health RN Case Manager   Direct [phone]

## 2024-08-27 ENCOUNTER — Telehealth: Payer: Self-pay

## 2024-08-27 NOTE — Patient Instructions (Signed)
 Diane Knapp - I am sorry I was unable to reach you today for our scheduled appointment. I work with [name], FNP and am calling to support your healthcare needs. Please contact me at [phone] at your earliest convenience. I look forward to speaking with you in the morning at 11:30   Thank you,  Hendricks Her RN, BSN  [place] VBCI-Population Health RN Case Manager   Direct [phone]

## 2024-08-28 ENCOUNTER — Other Ambulatory Visit: Payer: Self-pay

## 2024-08-28 NOTE — Patient Instructions (Signed)
 Visit Information  Diane Knapp was given information about Medicaid Managed Care team care coordination services as a part of their [facility] Community Plan Medicaid benefit.   If you would like to schedule transportation through your [facility], please call the following number at least 2 days in advance of your appointment: [phone]   Rides for urgent appointments can also be made after hours by calling Member Services.  Call the Behavioral Health Crisis Line at [phone], at any time, 24 hours a day, 7 days a week. If you are in danger or need immediate medical attention call 911.  Please see education materials related to COPD provided by MyChart link.  Care plan and visit instructions communicated with the patient verbally today. Patient agrees to receive a copy in MyChart. Active MyChart status and patient understanding of how to access instructions and care plan via MyChart confirmed with patient.     Telephone follow up appointment with Managed Medicaid care management team member scheduled for:09-28-2024 at 11:00 AM  Hendricks Her RN, BSN  [place] VBCI-Population Health RN Case Manager   Direct [phone]   Following is a copy of your plan of care:  There are no care plans that you recently modified to display for this patient.

## 2024-09-02 ENCOUNTER — Other Ambulatory Visit: Payer: Self-pay | Admitting: Licensed Clinical Social Worker

## 2024-09-02 NOTE — Patient Outreach (Signed)
 Complex Care Management   Visit Note  09/02/2024  Name:  Diane Knapp MRN: [id number] DOB: 1966/05/20  Situation: Referral received for Complex Care Management related to Mental/Behavioral Health diagnosis   I obtained verbal consent from Patient.  Visit completed with Patient  on the phone.  Background:   Past Medical History:  Diagnosis Date   Anxiety    Callus of foot    right bottom of foot- removed 11/24/15   Chronic cervical radicular pain (Right) 10/12/2015   COPD (chronic obstructive pulmonary disease) (HCC)    Depression    Encounter for long-term (current) use of medications 08/31/2015   Encounter for therapeutic drug level monitoring 11/24/2015   Hypertension    Hypomagnesemia 10/27/2015   Marijuana use (see 11/24/2015 UDS) 08/31/2015   Myelomalacia (HCC)    Noncompliance 08/31/2015   Patient previously discharged from [facility] pain clinic secondary to missing appointments. 10/12/2015 - patient is taking more medication than prescribed. 12/22/2015 - opioid analgesic pain management treatment option terminated today due to noncompliance with medication policy and signed medication agreement. Patient tested positive for cannabinoids on the 11/24/2015 UDS. [facility]   Noncompliance with medication treatment due to overuse of medication 10/12/2015   Spondylosis    Substance use disorder Risk: HIGH 08/31/2015   Previously found to have a UDS positive for unreported use of ETOH and cannabinoids on 12/07/2013.       Assessment: Patient Reported Symptoms:  Cognitive Cognitive Status: Alert and oriented to person, place, and time, Normal speech and language skills Cognitive/Intellectual Conditions Management [RPT]: None reported or documented in medical history or problem list      Neurological Neurological Review of Symptoms: Not assessed    HEENT HEENT Symptoms Reported: Not assessed      Cardiovascular Cardiovascular Symptoms Reported: Not assessed     Respiratory Respiratory Symptoms Reported: Not assesed    Endocrine Endocrine Symptoms Reported: Not assessed    Gastrointestinal Gastrointestinal Symptoms Reported: Not assessed      Genitourinary Genitourinary Symptoms Reported: Not assessed    Integumentary Integumentary Symptoms Reported: Not assessed    Musculoskeletal Musculoskelatal Symptoms Reviewed: Not assessed        Psychosocial Psychosocial Symptoms Reported: No symptoms reported Additional Psychological Details: reports there hs been no changes in mood          09/02/2024    PHQ2-9 Depression Screening   Little interest or pleasure in doing things    Feeling down, depressed, or hopeless    PHQ-2 - Total Score    Trouble falling or staying asleep, or sleeping too much    Feeling tired or having little energy    Poor appetite or overeating     Feeling bad about yourself - or that you are a failure or have let yourself or your family down    Trouble concentrating on things, such as reading the newspaper or watching television    Moving or speaking so slowly that other people could have noticed.  Or the opposite - being so fidgety or restless that you have been moving around a lot more than usual    Thoughts that you would be better off dead, or hurting yourself in some way    PHQ2-9 Total Score    If you checked off any problems, how difficult have these problems made it for you to do your work, take care of things at home, or get along with other people    Depression Interventions/Treatment  There were no vitals filed for this visit.    Medications Reviewed Today     Reviewed by Veva Bolt, LCSW (Social Worker) on 09/02/24 at 1108  Med List Status: <None>   Medication Order Taking? Sig Documenting Provider Last Dose Status Informant  amLODipine  (NORVASC ) 10 MG tablet [id number]  Take 1 tablet (10 mg total) by mouth daily. [name], FNP  Active   aspirin  EC 81 MG tablet [id number]  Take 1  tablet (81 mg total) by mouth daily. Swallow whole. Fernand Denyse LABOR, MD  Active   Cholecalciferol (VITAMIN D3) 2000 units capsule [id number]  Take 1 capsule (2,000 Units total) by mouth daily. Tanya Glisson, MD  Active            Med Note CARMIN, [place]   Wed Mar 18, 2024  1:18 PM)    FLUoxetine  HCl 60 MG TABS [id number]  Take 60 mg by mouth daily with breakfast. Stop Fluoxetine  40 mg Eappen, Saramma, MD  Active   isosorbide  mononitrate (IMDUR ) 30 MG 24 hr tablet [id number]  Take 1 tablet by mouth once daily Scoggins, Amber, NP  Active   losartan  (COZAAR ) 50 MG tablet [id number]  Take 50 mg by mouth daily. [provider]  Active   OLANZapine  (ZYPREXA ) 7.5 MG tablet [id number]  Take 1 tablet (7.5 mg total) by mouth at bedtime. Dose increase Eappen, Saramma, MD  Active   predniSONE  (DELTASONE ) 20 MG tablet [id number]  Take 2 tablets (40 mg total) by mouth daily with breakfast. [name], FNP  Active   pregabalin  (LYRICA ) 100 MG capsule [id number]  Take 1 capsule (100 mg total) by mouth 2 (two) times daily. [name], FNP  Active   propranolol  (INDERAL ) 20 MG tablet [id number]  Take 1 tablet (20 mg total) by mouth 3 (three) times daily. [name], FNP  Active   varenicline  (CHANTIX ) 0.5 MG tablet [id number]  Take 1 tablet (0.5 mg total) by mouth daily. Eappen, Saramma, MD  Active   Vitamin D , Ergocalciferol , (DRISDOL ) 1.25 MG (50000 UNIT) CAPS capsule [id number]  Take 1 capsule (50,000 Units total) by mouth every 7 (seven) days. [name], FNP  Active   Med List Note Elwanda Olam FORBES KEN 05/04/24 1511): Patient is currently driving, will review medications at next visit.            Recommendation:   Continue Current Plan of Care  Follow Up Plan:   Closing From:  Complex Care Management- social work.   Bolt Veva, LCSW Clinical Social Worker [facility]

## 2024-09-03 NOTE — Patient Instructions (Signed)
 Visit Information  Thank you for taking time to visit with me today. Please don't hesitate to contact me if I can be of assistance to you in the future.  Closing From: Complex Care Management- social work referral.  Please call the care guide team at [phone] if you need to cancel, schedule, or reschedule an appointment.   Please call 911 if you are experiencing a Mental Health or Behavioral Health Crisis or need someone to talk to.  Cena Ligas, LCSW Clinical Social Worker [facility]

## 2024-09-03 NOTE — Patient Outreach (Signed)
 Complex Care Management   Visit Note  09/03/2024  Name:  CELSEY ASSELIN MRN: [id number] DOB: Feb 28, 1966  Situation: Referral received for Complex Care Management related to Mental/Behavioral Health diagnosis   I obtained verbal consent from Patient.  Visit completed with Patient  on the phone  Background:   Past Medical History:  Diagnosis Date   Anxiety    Callus of foot    right bottom of foot- removed 11/24/15   Chronic cervical radicular pain (Right) 10/12/2015   COPD (chronic obstructive pulmonary disease) (HCC)    Depression    Encounter for long-term (current) use of medications 08/31/2015   Encounter for therapeutic drug level monitoring 11/24/2015   Hypertension    Hypomagnesemia 10/27/2015   Marijuana use (see 11/24/2015 UDS) 08/31/2015   Myelomalacia (HCC)    Noncompliance 08/31/2015   Patient previously discharged from [facility] pain clinic secondary to missing appointments. 10/12/2015 - patient is taking more medication than prescribed. 12/22/2015 - opioid analgesic pain management treatment option terminated today due to noncompliance with medication policy and signed medication agreement. Patient tested positive for cannabinoids on the 11/24/2015 UDS. [facility]   Noncompliance with medication treatment due to overuse of medication 10/12/2015   Spondylosis    Substance use disorder Risk: HIGH 08/31/2015   Previously found to have a UDS positive for unreported use of ETOH and cannabinoids on 12/07/2013.       Assessment: LCSW called and spoke to patient over the phone. Patient reports she is doing well. Patient reports that she has made contact with [facility]. Patient stated she had no other social work needs. LCSW will end Social worker services.   Patient Reported Symptoms:  Cognitive Cognitive Status: Alert and oriented to person, place, and time, Normal speech and language skills Cognitive/Intellectual Conditions Management [RPT]: None  reported or documented in medical history or problem list      Neurological Neurological Review of Symptoms: Not assessed    HEENT HEENT Symptoms Reported: Not assessed      Cardiovascular Cardiovascular Symptoms Reported: Not assessed    Respiratory Respiratory Symptoms Reported: Not assesed    Endocrine Endocrine Symptoms Reported: Not assessed    Gastrointestinal Gastrointestinal Symptoms Reported: Not assessed      Genitourinary Genitourinary Symptoms Reported: Not assessed    Integumentary Integumentary Symptoms Reported: Not assessed    Musculoskeletal Musculoskelatal Symptoms Reviewed: Not assessed        Psychosocial Psychosocial Symptoms Reported: No symptoms reported Additional Psychological Details: reports there hs been no changes in mood          09/03/2024    PHQ2-9 Depression Screening   Little interest or pleasure in doing things    Feeling down, depressed, or hopeless    PHQ-2 - Total Score    Trouble falling or staying asleep, or sleeping too much    Feeling tired or having little energy    Poor appetite or overeating     Feeling bad about yourself - or that you are a failure or have let yourself or your family down    Trouble concentrating on things, such as reading the newspaper or watching television    Moving or speaking so slowly that other people could have noticed.  Or the opposite - being so fidgety or restless that you have been moving around a lot more than usual    Thoughts that you would be better off dead, or hurting yourself in some way    PHQ2-9  Total Score    If you checked off any problems, how difficult have these problems made it for you to do your work, take care of things at home, or get along with other people    Depression Interventions/Treatment      There were no vitals filed for this visit.    Medications Reviewed Today     Reviewed by Veva Bolt, LCSW (Social Worker) on 09/02/24 at 1108  Med List Status: <None>    Medication Order Taking? Sig Documenting Provider Last Dose Status Informant  amLODipine  (NORVASC ) 10 MG tablet [id number]  Take 1 tablet (10 mg total) by mouth daily. [name], FNP  Active   aspirin  EC 81 MG tablet [id number]  Take 1 tablet (81 mg total) by mouth daily. Swallow whole. Fernand Denyse LABOR, MD  Active   Cholecalciferol (VITAMIN D3) 2000 units capsule [id number]  Take 1 capsule (2,000 Units total) by mouth daily. Tanya Glisson, MD  Active            Med Note CARMIN, [place]   Wed Mar 18, 2024  1:18 PM)    FLUoxetine  HCl 60 MG TABS [id number]  Take 60 mg by mouth daily with breakfast. Stop Fluoxetine  40 mg Eappen, Saramma, MD  Active   isosorbide  mononitrate (IMDUR ) 30 MG 24 hr tablet [id number]  Take 1 tablet by mouth once daily Scoggins, Amber, NP  Active   losartan  (COZAAR ) 50 MG tablet [id number]  Take 50 mg by mouth daily. [provider]  Active   OLANZapine  (ZYPREXA ) 7.5 MG tablet [id number]  Take 1 tablet (7.5 mg total) by mouth at bedtime. Dose increase Eappen, Saramma, MD  Active   predniSONE  (DELTASONE ) 20 MG tablet [id number]  Take 2 tablets (40 mg total) by mouth daily with breakfast. [name], FNP  Active   pregabalin  (LYRICA ) 100 MG capsule [id number]  Take 1 capsule (100 mg total) by mouth 2 (two) times daily. [name], FNP  Active   propranolol  (INDERAL ) 20 MG tablet [id number]  Take 1 tablet (20 mg total) by mouth 3 (three) times daily. [name], FNP  Active   varenicline  (CHANTIX ) 0.5 MG tablet [id number]  Take 1 tablet (0.5 mg total) by mouth daily. Eappen, Saramma, MD  Active   Vitamin D , Ergocalciferol , (DRISDOL ) 1.25 MG (50000 UNIT) CAPS capsule [id number]  Take 1 capsule (50,000 Units total) by mouth every 7 (seven) days. [name], FNP  Active   Med List Note Elwanda Olam FORBES KEN 05/04/24 1511): Patient is currently driving, will review medications at next visit.            Recommendation:   PCP  Follow-up Continue Current Plan of Care  Follow Up Plan:   Patient has met all care management goals. Care Management- social work-case will be closed. Patient has been provided contact information should new needs arise.   Bolt Veva, LCSW Clinical Social Worker [facility]

## 2024-09-07 ENCOUNTER — Encounter: Payer: Self-pay | Admitting: Orthopedic Surgery

## 2024-09-07 NOTE — Progress Notes (Unsigned)
 Referring Physician:  [name], FNP [address] [place],  [place] [address]  Primary Physician:  [name], FNP  History of Present Illness: 09/10/2024 Ms. Diane Knapp has a history of HTN, COPD, demyelinating disease, FM, GAD, chronic pain, PTSD.   History of ACDF C6-C7 in 2011- sounds like it was for myelopathy as she could not walk preop. She's continued with leg weakness  since this surgery. She's also continued with constant neck and right arm pain.   PCP has also referred her to pain management- has not contacted her yet.   She has constant neck pain with radiation to her arm into her hand. No left arm pain. She has numbness, tingling, and weakness in right arm. No alleviating factors. Pain is worse with laying flat. Walking improved after surgery as above, but it's been getting worse. Balance is off. She has some dexterity issues.   She also has constant LBP with bilateral leg spasms and aching to her ankles. She also had intermittent bilateral groin pain when her back pain is severe. She has numbness, tingling, and weakness in both legs, right > left leg. Pain is worse with walking, prolonged sitting. Some relief with OTC muscle rub.    Tobacco use: smokes 1/2 PPD (8 cigarettes a day) x 40 years.   Bowel/Bladder Dysfunction: she's had urinary leakage since previous bladder surgery years ago. No bowel issues.   Conservative measures:  Physical therapy:  has not participated in Multimodal medical therapy including regular antiinflammatories:  Prednisone , Lyrica  Injections:   12/15/2015- TFESI right-sided L4-5 lumbar   Past Surgery:  Hx ACDF C6-C7- 2011  [name] has symptoms of cervical myelopathy.  The symptoms are causing a significant impact on the patient's life.   Review of Systems:  A 10 point review of systems is negative, except for the pertinent positives and negatives detailed in the HPI.  Past Medical History: Past Medical History:   Diagnosis Date   Anxiety    Callus of foot    right bottom of foot- removed 11/24/15   Chronic cervical radicular pain (Right) 10/12/2015   COPD (chronic obstructive pulmonary disease) (HCC)    Depression    Encounter for long-term (current) use of medications 08/31/2015   Encounter for therapeutic drug level monitoring 11/24/2015   Hypertension    Hypomagnesemia 10/27/2015   Marijuana use (see 11/24/2015 UDS) 08/31/2015   Myelomalacia (HCC)    Noncompliance 08/31/2015   Patient previously discharged from [facility] pain clinic secondary to missing appointments. 10/12/2015 - patient is taking more medication than prescribed. 12/22/2015 - opioid analgesic pain management treatment option terminated today due to noncompliance with medication policy and signed medication agreement. Patient tested positive for cannabinoids on the 11/24/2015 UDS. [facility]   Noncompliance with medication treatment due to overuse of medication 10/12/2015   Spondylosis    Substance use disorder Risk: HIGH 08/31/2015   Previously found to have a UDS positive for unreported use of ETOH and cannabinoids on 12/07/2013.       Past Surgical History: Past Surgical History:  Procedure Laterality Date   CERVICAL SPINE SURGERY     ACDF 2011 C6-C7   RIGHT OOPHORECTOMY      Allergies: Allergies as of 09/10/2024 - Review Complete 09/10/2024  Allergen Reaction Noted   Duloxetine Other (See Comments) 11/24/2015   Lisinopril  Cough 03/04/2024   Cymbalta [duloxetine hcl] Other (See Comments) 07/09/2015   Lyrica  [pregabalin ] Other (See Comments) 05/20/2024    Medications:  Outpatient Encounter Medications as of 09/10/2024  Medication Sig   amLODipine  (NORVASC ) 10 MG tablet Take 1 tablet (10 mg total) by mouth daily.   aspirin  EC 81 MG tablet Take 1 tablet (81 mg total) by mouth daily. Swallow whole.   Cholecalciferol (VITAMIN D3) 2000 units capsule Take 1 capsule (2,000 Units total) by mouth daily.    FLUoxetine  HCl 60 MG TABS Take 60 mg by mouth daily with breakfast. Stop Fluoxetine  40 mg   isosorbide  mononitrate (IMDUR ) 30 MG 24 hr tablet Take 1 tablet by mouth once daily   losartan  (COZAAR ) 50 MG tablet Take 50 mg by mouth daily.   OLANZapine  (ZYPREXA ) 7.5 MG tablet Take 1 tablet (7.5 mg total) by mouth at bedtime. Dose increase   pregabalin  (LYRICA ) 100 MG capsule Take 1 capsule (100 mg total) by mouth 2 (two) times daily.   propranolol  (INDERAL ) 20 MG tablet Take 1 tablet (20 mg total) by mouth 3 (three) times daily.   [DISCONTINUED] predniSONE  (DELTASONE ) 20 MG tablet Take 2 tablets (40 mg total) by mouth daily with breakfast.   [DISCONTINUED] varenicline  (CHANTIX ) 0.5 MG tablet Take 1 tablet (0.5 mg total) by mouth daily.   [DISCONTINUED] Vitamin D , Ergocalciferol , (DRISDOL ) 1.25 MG (50000 UNIT) CAPS capsule Take 1 capsule (50,000 Units total) by mouth every 7 (seven) days.   No facility-administered encounter medications on file as of 09/10/2024.    Social History: Social History   Tobacco Use   Smoking status: Every Day    Current packs/day: 0.50    Types: Cigarettes   Smokeless tobacco: Never  Vaping Use   Vaping status: Never Used  Substance Use Topics   Alcohol use: Yes    Alcohol/week: 1.0 standard drink of alcohol    Types: 1 Shots of liquor per week    Comment: occasionally   Drug use: Not Currently    Types: Marijuana    Family Medical History: Family History  Problem Relation Age of Onset   Depression Mother    Diabetes Mother    Cancer Mother    Stroke Mother    Hypertension Mother    Schizophrenia Sister    Depression Sister    Drug abuse Brother    Depression Brother    Drug abuse Maternal Uncle    Drug abuse Paternal Uncle    Drug abuse Cousin     Physical Examination: Vitals:   09/10/24 1054 09/10/24 1137  BP: (!) 168/96 (!) 154/92    General: Patient is well developed, well nourished, calm, collected, and in no apparent distress.  Attention to examination is appropriate.  Respiratory: Patient is breathing without any difficulty.   NEUROLOGICAL:     Awake, alert, oriented to person, place, and time.  Speech is clear and fluent. Fund of knowledge is appropriate.   Cranial Nerves: Pupils equal round and reactive to light.  Facial tone is symmetric.    She has diffuse posterior cervical, thoracic, and lumbar tenderness.   No abnormal lesions on exposed skin.   Strength: Side Biceps Triceps Deltoid Interossei Grip Wrist Ext. Wrist Flex.  R 5 5 5 5 5 4 5   L 5 5 5 5 5 4 5    Side Iliopsoas Quads Hamstring PF DF EHL  R 5 5 5 5  4- 4-  L 5 5 5 5 5 5    Reflexes are 3+ and symmetric at the biceps, brachioradialis, patella and achilles.   Hoffman's is present on right and absent on left.  Clonus is  not present.   Bilateral upper and lower extremity sensation is intact to light touch.     Gait is slow and very unsteady. She ambulates with a cane.   Medical Decision Making  Imaging: No recent lumbar imaging.   Cervical MRI dated 09/10/23:  FINDINGS: Alignment: Reversal of the normal cervical lordosis centered at the C4 level   Vertebrae: No fracture, evidence of discitis, or bone lesion. Postsurgical changes from C6-C7 ACDF.   Cord: There is myelomalacia within the cervical spinal cord at the C6-C7 level, which is a surgical level (series 8, image 32), unchanged from prior exam. Mild T2 hyperintense signal abnormality is also present within the spinal cord at the C3-C4 level (series 8, image 17), where there is deformation of the ventral spinal cord   Posterior Fossa, vertebral arteries, paraspinal tissues: Negative.   Disc levels:   C1-C2: Mild degenerative change.   C2-C3: Mild bilateral facet degenerative change. No significant spinal canal narrowing. No neural foraminal narrowing.   C3-C4: Central disc protrusion that deforms the ventral spinal cord and results in moderate overall spinal canal  narrowing. Mild bilateral facet degenerative change. Mild bilateral neural foraminal narrowing.   C4-C5: Circumferential disc bulge. Mild bilateral facet degenerative change. Moderate spinal canal narrowing. Moderate bilateral neural foraminal narrowing. Uncovertebral hypertrophy.   C5-C6: Central disc protrusion that deforms the ventral spinal cord and results in mild overall spinal canal narrowing. No neural foraminal narrowing. No significant facet degenerative change.   C6-C7: Fused level. No spinal canal narrowing. No neural foraminal narrowing.   C7-T1: Uncovertebral hypertrophy. Moderate bilateral facet degenerative change. Severe bilateral neural foraminal narrowing, right-greater-than-left. Minimal disc bulge. Mild spinal canal narrowing.   IMPRESSION: 1. Postsurgical changes from C6-C7 ACDF. Myelomalacia within the cervical spinal cord at the C6-C7 level, which is a surgical level, is unchanged from prior exam. 2. Central disc protrusions at C3-C4, C4-C5, and C5-C6 that deform the ventral spinal cord and result in moderate spinal canal narrowing at C3-C4 and C4-C5. 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 3. Severe bilateral neural foraminal narrowing at C7-T1, right-greater-than-left.     Electronically Signed   By: Lyndall Gore M.D.   On: 09/29/2023 11:39   I have personally reviewed the images and agree with the above interpretation.  Assessment and Plan: Ms. Sofranko has a history of ACDF C6-C7 in 2011- sounds like it was for myelopathy as she could not walk preop. She's continued with leg weakness  since this surgery. She's also continued with constant neck and right arm pain.   She constant neck pain with radiation to her arm into her hand. No left arm pain. She has numbness, tingling, and weakness in right arm. Walking improved after surgery as  above, but it's been getting worse. Balance is off. She has some dexterity issues.   Cervical MRI from last year shows moderate central stenosis C3-C4 with myelomalacia, moderate central stenosis C4-C5, mild central stenosis C5-C6 and C7-T1. Also with mild/moderate foraminal stenosis C3-C5 and severe central stenosis C7-T1.   She had unsteady gait with hyper reflexia and positive hoffmans. Also with weakness in bilateral wrist extension.     She also has constant LBP with bilateral leg spasms and aching to her ankles. She also had intermittent bilateral groin pain when her back pain is severe. She has numbness, tingling, and weakness in both legs, right > left leg.   No  lumbar imaging. She has weakness in right DF/EHL.   Treatment options discussed with patient and following plan made:   - Cervical MRI scan to further evaluate previously seen myelomalacia.  - Lumbar MRI ordered to evaluate back and bilateral leg pain.  - She will follow up once I have her MRI results back. Can do phone visit, in person visit, or MyChart visit.   BP was elevated. No symptoms of chest pain, shortness of breath, blurry vision, or headaches. She  will recheck at home and call PCP if not improved. If she develops CP, SOB, blurry vision, or headaches, then she will go to ED. Message sent to PCP to advise them of elevated BP reading.    I spent a total of 45 minutes in face-to-face and non-face-to-face activities related to this patient's care today including review of outside records, review of imaging, review of symptoms, physical exam, discussion of differential diagnosis, discussion of treatment options, and documentation.   Thank you for involving me in the care of this patient.   Glade Boys PA-C Dept. of Neurosurgery

## 2024-09-10 ENCOUNTER — Ambulatory Visit: Admitting: Orthopedic Surgery

## 2024-09-10 ENCOUNTER — Encounter: Payer: Self-pay | Admitting: Orthopedic Surgery

## 2024-09-10 VITALS — BP 154/92 | Ht 67.0 in | Wt 159.4 lb

## 2024-09-10 DIAGNOSIS — M5441 Lumbago with sciatica, right side: Secondary | ICD-10-CM

## 2024-09-10 DIAGNOSIS — M5416 Radiculopathy, lumbar region: Secondary | ICD-10-CM

## 2024-09-10 DIAGNOSIS — R29898 Other symptoms and signs involving the musculoskeletal system: Secondary | ICD-10-CM

## 2024-09-10 DIAGNOSIS — Z981 Arthrodesis status: Secondary | ICD-10-CM

## 2024-09-10 DIAGNOSIS — M4802 Spinal stenosis, cervical region: Secondary | ICD-10-CM

## 2024-09-10 DIAGNOSIS — M542 Cervicalgia: Secondary | ICD-10-CM | POA: Diagnosis not present

## 2024-09-10 DIAGNOSIS — R2689 Other abnormalities of gait and mobility: Secondary | ICD-10-CM

## 2024-09-10 DIAGNOSIS — R292 Abnormal reflex: Secondary | ICD-10-CM | POA: Diagnosis not present

## 2024-09-10 DIAGNOSIS — M5442 Lumbago with sciatica, left side: Secondary | ICD-10-CM

## 2024-09-10 DIAGNOSIS — M5412 Radiculopathy, cervical region: Secondary | ICD-10-CM | POA: Diagnosis not present

## 2024-09-10 NOTE — Patient Instructions (Addendum)
 It was so nice to see you today. Thank you so much for coming in.    I want to get an MRI of your neck and lower back to look into things further. We will get this approved through your insurance and [facility] will call you to schedule the appointment. Ask about your patient responsibility. You do not need to pay this prior to getting MRI, they can bill you.   Make sure you are scheduled for the WIDE BORE MRI.   After you have the MRI scans, it can take 14-28 days for me to get the results back. If I don't have them in 2 weeks, we will call to try to get the results.   Once I have the results, we will call you to schedule a follow up visit with me to review them. We can do a phone visit, MyChart visit, or in person visit.   Your blood pressure was elevated today. I want you to recheck it at home and follow up with your PCP if it remains high. If you have any chest pain, shortness of breath, blurry vision, or headaches then you need to go to ED. We have also sent your PCP a message to let them know about your elevated blood pressure.    Please do not hesitate to call if you have any questions or concerns. You can also message me in MyChart.   Glade Boys PA-C [phone]     The physicians and staff at [facility] are committed to providing excellent care. You may receive a survey asking for feedback about your experience at our office. We value you your feedback and appreciate you taking the time to to fill it out. The [facility] leadership team is also available to discuss your experience in person, feel free to contact us  [phone].

## 2024-09-24 ENCOUNTER — Telehealth: Payer: Self-pay | Admitting: Psychiatry

## 2024-09-24 ENCOUNTER — Other Ambulatory Visit: Payer: Self-pay | Admitting: Family

## 2024-09-24 ENCOUNTER — Other Ambulatory Visit: Payer: Self-pay | Admitting: Psychiatry

## 2024-09-24 DIAGNOSIS — F331 Major depressive disorder, recurrent, moderate: Secondary | ICD-10-CM

## 2024-09-24 MED ORDER — OLANZAPINE 7.5 MG PO TABS: 7.5000 mg | ORAL_TABLET | Freq: Every day | ORAL | 0 refills | Status: AC

## 2024-09-24 NOTE — Telephone Encounter (Signed)
 I did not see an appointment scheduled for this patient.  I have sent 30-day supply of olanzapine .  No future refills without appointment.

## 2024-09-24 NOTE — Telephone Encounter (Signed)
 Noted, let us  give a week for patient to call back.

## 2024-09-25 ENCOUNTER — Ambulatory Visit: Payer: Self-pay

## 2024-09-25 NOTE — Telephone Encounter (Signed)
Forwarded to front desk for scheduling.

## 2024-09-28 ENCOUNTER — Other Ambulatory Visit: Payer: Self-pay

## 2024-09-28 NOTE — Patient Instructions (Signed)
 [name] - I am sorry I was unable to reach you today for our scheduled appointment. I work with [name], FNP and am calling to support your healthcare needs. Please contact me at [phone] at your earliest convenience. I look forward to speaking with you soon.   Thank you,  Hendricks Her RN, BSN  [place] [facility] RN Case Manager   Direct [phone]

## 2024-09-29 ENCOUNTER — Telehealth: Payer: Self-pay

## 2024-09-29 NOTE — Patient Instructions (Signed)
 Diane Knapp Ned - I am sorry I was unable to reach you today for our scheduled appointment. I work with [name], FNP and am calling to support your healthcare needs. Please contact me at [phone] at your earliest convenience. I look forward to speaking with you soon.   Thank you,  Hendricks Her RN, BSN  [place] [facility] RN Case Manager   Direct [phone]

## 2024-10-01 ENCOUNTER — Telehealth: Payer: Self-pay

## 2024-10-01 NOTE — Patient Instructions (Signed)
 Diane Knapp - I have attempted to call you three times but have been unsuccessful in reaching you. I work with [name], FNP and am calling to support your healthcare needs. If I can be of assistance to you, please contact me at [phone].     Thank you,  Hendricks Her RN, BSN  [place] [facility] RN Case Manager   Direct [phone]

## 2024-10-01 NOTE — Telephone Encounter (Signed)
 Noted

## 2024-10-05 ENCOUNTER — Ambulatory Visit: Admitting: Family

## 2024-10-05 VITALS — BP 138/86 | HR 68 | Ht 67.0 in | Wt 160.2 lb

## 2024-10-05 DIAGNOSIS — R42 Dizziness and giddiness: Secondary | ICD-10-CM

## 2024-10-05 DIAGNOSIS — E782 Mixed hyperlipidemia: Secondary | ICD-10-CM

## 2024-10-05 DIAGNOSIS — I10 Essential (primary) hypertension: Secondary | ICD-10-CM | POA: Diagnosis not present

## 2024-10-05 DIAGNOSIS — F331 Major depressive disorder, recurrent, moderate: Secondary | ICD-10-CM

## 2024-10-05 DIAGNOSIS — F411 Generalized anxiety disorder: Secondary | ICD-10-CM | POA: Diagnosis not present

## 2024-10-05 DIAGNOSIS — G379 Demyelinating disease of central nervous system, unspecified: Secondary | ICD-10-CM

## 2024-10-05 DIAGNOSIS — F4310 Post-traumatic stress disorder, unspecified: Secondary | ICD-10-CM

## 2024-10-05 DIAGNOSIS — R0602 Shortness of breath: Secondary | ICD-10-CM

## 2024-10-05 DIAGNOSIS — R0789 Other chest pain: Secondary | ICD-10-CM | POA: Diagnosis not present

## 2024-10-05 DIAGNOSIS — J439 Emphysema, unspecified: Secondary | ICD-10-CM | POA: Diagnosis not present

## 2024-10-05 DIAGNOSIS — Z1231 Encounter for screening mammogram for malignant neoplasm of breast: Secondary | ICD-10-CM

## 2024-10-05 MED ORDER — VARENICLINE TARTRATE 1 MG PO TABS: 1.0000 mg | ORAL_TABLET | Freq: Two times a day (BID) | ORAL | 1 refills | Status: AC

## 2024-10-05 MED ORDER — ISOSORBIDE MONONITRATE ER 30 MG PO TB24: 30.0000 mg | ORAL_TABLET | Freq: Every day | ORAL | 0 refills | Status: AC

## 2024-10-05 MED ORDER — PREGABALIN 100 MG PO CAPS: 100.0000 mg | ORAL_CAPSULE | Freq: Two times a day (BID) | ORAL | 2 refills | Status: AC

## 2024-10-05 NOTE — Patient Instructions (Addendum)
 1) Premier [employer] -  these are the protein shakes we were talking about.  They have multiple different flavors, they really don't taste as bad as some of the others.    2) [facility]  [address] [place],  [place]  [address]  Main: [phone]   3) Call to schedule your MRI:  [address] [place], [place] [address]  Phone [phone]  4) Increased dose of Chantix  sent to the pharmacy for you.

## 2024-10-10 NOTE — Progress Notes (Unsigned)
 "  Established Patient Office Visit  Subjective:  Patient ID: Diane Knapp, female    DOB: [birth year]  Age: 58 y.o. MRN: [id number]  Chief Complaint  Patient presents with   Follow-up    2 month follow up. Would like to increase the chantix  script.    HPI  No other concerns at this time.   Past Medical History:  Diagnosis Date   Anxiety    Callus of foot    right bottom of foot- removed 11/24/15   Chronic cervical radicular pain (Right) 10/12/2015   COPD (chronic obstructive pulmonary disease) (HCC)    Depression    Encounter for long-term (current) use of medications 08/31/2015   Encounter for therapeutic drug level monitoring 11/24/2015   Hypertension    Hypomagnesemia 10/27/2015   Marijuana use (see 11/24/2015 UDS) 08/31/2015   Myelomalacia (HCC)    Noncompliance 08/31/2015   Patient previously discharged from [facility] pain clinic secondary to missing appointments. 10/12/2015 - patient is taking more medication than prescribed. 12/22/2015 - opioid analgesic pain management treatment option terminated today due to noncompliance with medication policy and signed medication agreement. Patient tested positive for cannabinoids on the 11/24/2015 UDS. [facility]   Noncompliance with medication treatment due to overuse of medication 10/12/2015   Spondylosis    Substance use disorder Risk: HIGH 08/31/2015   Previously found to have a UDS positive for unreported use of ETOH and cannabinoids on 12/07/2013.       Past Surgical History:  Procedure Laterality Date   CERVICAL SPINE SURGERY     ACDF 2011 C6-C7   RIGHT OOPHORECTOMY      Social History   Socioeconomic History   Marital status: Legally Separated    Spouse name: Not on file   Number of children: 2   Years of education: Not on file   Highest education level: 11th grade  Occupational History   Not on file  Tobacco Use   Smoking status: Every Day    Current packs/day: 0.50     Types: Cigarettes   Smokeless tobacco: Never  Vaping Use   Vaping status: Never Used  Substance and Sexual Activity   Alcohol use: Yes    Alcohol/week: 1.0 standard drink of alcohol    Types: 1 Shots of liquor per week    Comment: occasionally   Drug use: Not Currently    Types: Marijuana   Sexual activity: Not Currently  Other Topics Concern   Not on file  Social History Narrative   Left handed   Caffeine 6-8 cups daily   Homeless, lives in her car   Social Drivers of Health   Tobacco Use: High Risk (09/10/2024)   Patient History    Smoking Tobacco Use: Every Day    Smokeless Tobacco Use: Never    Passive Exposure: Not on file  Financial Resource Strain: Not on file  Food Insecurity: Food Insecurity Present (08/28/2024)   Epic    Worried About Programme Researcher, Broadcasting/film/video in the Last Year: Often true    [employer] of Food in the Last Year: Often true  Transportation Needs: No Transportation Needs (08/28/2024)   Epic    Lack of Transportation (Medical): No    Lack of Transportation (Non-Medical): No  Physical Activity: Not on file  Stress: Not on file  Social Connections: Not on file  Intimate Partner Violence: Not At Risk (08/28/2024)   Epic    Fear of Current or Ex-Partner: No    Emotionally  Abused: No    Physically Abused: No    Sexually Abused: No  Depression (PHQ2-9): High Risk (08/28/2024)   Depression (PHQ2-9)    PHQ-2 Score: 17  Alcohol Screen: Not on file  Housing: Low Risk (08/28/2024)   Epic    Unable to Pay for Housing in the Last Year: No    Number of Times Moved in the Last Year: 1    Homeless in the Last Year: No  Utilities: Not At Risk (08/28/2024)   Epic    Threatened with loss of utilities: No  Health Literacy: Not on file    Family History  Problem Relation Age of Onset   Depression Mother    Diabetes Mother    Cancer Mother    Stroke Mother    Hypertension Mother    Schizophrenia Sister    Depression Sister    Drug  abuse Brother    Depression Brother    Drug abuse Maternal Uncle    Drug abuse Paternal Uncle    Drug abuse Cousin     Allergies[1]  Review of Systems  All other systems reviewed and are negative.      Objective:   BP 138/86   Pulse 68   Ht 5' 7 (1.702 m)   Wt 160 lb 3.2 oz (72.7 kg)   SpO2 98%   BMI 25.09 kg/m   Vitals:   10/05/24 1303  BP: 138/86  Pulse: 68  Height: 5' 7 (1.702 m)  Weight: 160 lb 3.2 oz (72.7 kg)  SpO2: 98%  BMI (Calculated): 25.08    Physical Exam Vitals and nursing note reviewed.  Constitutional:      Appearance: Normal appearance. She is normal weight.  HENT:     Head: Normocephalic.  Eyes:     Extraocular Movements: Extraocular movements intact.     Conjunctiva/sclera: Conjunctivae normal.     Pupils: Pupils are equal, round, and reactive to light.  Cardiovascular:     Rate and Rhythm: Normal rate.  Pulmonary:     Effort: Pulmonary effort is normal.  Neurological:     General: No focal deficit present.     Mental Status: She is alert and oriented to person, place, and time. Mental status is at baseline.  Psychiatric:        Mood and Affect: Mood normal.        Behavior: Behavior normal.        Thought Content: Thought content normal.      No results found for any visits on 10/05/24.  Recent Results (from the past 2160 hours)  CMP14+EGFR     Status: Abnormal   Collection Time: 08/03/24 12:23 PM  Result Value Ref Range   Glucose 95 70 - 99 mg/dL   BUN 8 6 - 24 mg/dL   Creatinine, Ser 9.04 0.57 - 1.00 mg/dL   eGFR 70 >40 fO/fpw/8.26   BUN/Creatinine Ratio 8 (L) 9 - 23   Sodium 140 134 - 144 mmol/L   Potassium 3.9 3.5 - 5.2 mmol/L   Chloride 102 96 - 106 mmol/L   CO2 22 20 - 29 mmol/L   Calcium 9.5 8.7 - 10.2 mg/dL   Total Protein 6.9 6.0 - 8.5 g/dL   Albumin 4.4 3.8 - 4.9 g/dL   Globulin, Total 2.5 1.5 - 4.5 g/dL   Bilirubin Total 0.3 0.0 - 1.2 mg/dL   Alkaline Phosphatase 91 49 - 135 IU/L   AST 21 0 - 40 IU/L    ALT 15  0 - 32 IU/L  Lipid panel     Status: Abnormal   Collection Time: 08/03/24 12:23 PM  Result Value Ref Range   Cholesterol, Total 213 (H) 100 - 199 mg/dL   Triglycerides 884 0 - 149 mg/dL   HDL 62 >60 mg/dL   VLDL Cholesterol Cal 20 5 - 40 mg/dL   LDL Chol Calc (NIH) 868 (H) 0 - 99 mg/dL   Chol/HDL Ratio 3.4 0.0 - 4.4 ratio    Comment:                                   T. Chol/HDL Ratio                                             Men  Women                               1/2 Avg.Risk  3.4    3.3                                   Avg.Risk  5.0    4.4                                2X Avg.Risk  9.6    7.1                                3X Avg.Risk 23.4   11.0   VITAMIN D  25 Hydroxy (Vit-D Deficiency, Fractures)     Status: None   Collection Time: 08/03/24 12:23 PM  Result Value Ref Range   Vit D, 25-Hydroxy 36.1 30.0 - 100.0 ng/mL    Comment: Vitamin D  deficiency has been defined by the Institute of Medicine and an Endocrine Society practice guideline as a level of serum 25-OH vitamin D  less than 20 ng/mL (1,2). The Endocrine Society went on to further define vitamin D  insufficiency as a level between 21 and 29 ng/mL (2). 1. IOM (Institute of Medicine). 2010. Dietary reference    intakes for calcium and D. [place]: The    [employer]. 2. Holick MF, Binkley [place], Bischoff-Ferrari HA, et al.    Evaluation, treatment, and prevention of vitamin D     deficiency: an Endocrine Society clinical practice    guideline. JCEM. 2011 Jul; 96(7):1911-30.   Vitamin B12     Status: None   Collection Time: 08/03/24 12:23 PM  Result Value Ref Range   Vitamin B-12 590 232 - 1,245 pg/mL  CBC with Diff     Status: Abnormal   Collection Time: 08/03/24 12:23 PM  Result Value Ref Range   WBC 10.0 3.4 - 10.8 x10E3/uL   RBC 4.37 3.77 - 5.28 x10E6/uL   Hemoglobin 12.8 11.1 - 15.9 g/dL   Hematocrit 60.2 65.9 - 46.6 %   MCV 91 79 - 97 fL   MCH 29.3 26.6 - 33.0 pg   MCHC 32.2  31.5 - 35.7 g/dL   RDW 86.3 88.2 - 84.5 %   Platelets 270 150 - 450  x10E3/uL   Neutrophils 56 Not Estab. %   Lymphs 35 Not Estab. %   Monocytes 7 Not Estab. %   Eos 1 Not Estab. %   Basos 1 Not Estab. %   Neutrophils Absolute 5.7 1.4 - 7.0 x10E3/uL   Lymphocytes Absolute 3.6 (H) 0.7 - 3.1 x10E3/uL   Monocytes Absolute 0.7 0.1 - 0.9 x10E3/uL   EOS (ABSOLUTE) 0.1 0.0 - 0.4 x10E3/uL   Basophils Absolute 0.1 0.0 - 0.2 x10E3/uL   Immature Granulocytes 0 Not Estab. %   Immature Grans (Abs) 0.0 0.0 - 0.1 x10E3/uL  Hemoglobin A1c     Status: Abnormal   Collection Time: 08/03/24 12:23 PM  Result Value Ref Range   Hgb A1c MFr Bld 5.7 (H) 4.8 - 5.6 %    Comment:          Prediabetes: 5.7 - 6.4          Diabetes: >6.4          Glycemic control for adults with diabetes: <7.0    Est. average glucose Bld gHb Est-mCnc 117 mg/dL  TSH     Status: None   Collection Time: 08/03/24 12:23 PM  Result Value Ref Range   TSH 0.623 0.450 - 4.500 uIU/mL  Magnesium      Status: None   Collection Time: 08/03/24 12:23 PM  Result Value Ref Range   Magnesium  2.0 1.6 - 2.3 mg/dL       Assessment & Plan:   Assessment & Plan Pulmonary emphysema (HCC)  Essential hypertension, benign  Mixed hyperlipidemia  Other chest pain  SOB (shortness of breath)  Dizziness  Demyelinating disease (HCC)  Screening mammogram for breast cancer     Return in about 2 months (around 12/06/2024).   Total time spent: {AMA time spent:29001} minutes  ALAN CHRISTELLA ARRANT, FNP  10/05/2024   This document may have been prepared by [facility] Voice Recognition software and as such may include unintentional dictation errors.       [1] Allergies Allergen Reactions   Duloxetine Other (See Comments)   Lisinopril  Cough   Cymbalta [Duloxetine Hcl] Other (See Comments)    hallucinations   Lyrica  [Pregabalin ] Other (See Comments)    Causes disorientation, confusion  "

## 2024-10-11 ENCOUNTER — Encounter: Payer: Self-pay | Admitting: Family

## 2024-10-11 NOTE — Assessment & Plan Note (Signed)
 Setting patient up for referral to Psychiatry and Psychology.  Will defer to them for further treatment changes.  Reassess at follow up.

## 2024-10-11 NOTE — Assessment & Plan Note (Signed)
 Blood pressure well controlled with current medications.  Continue current therapy.  Will reassess at follow up.   - CBC w/Diff - CMP w/eGFR

## 2024-10-11 NOTE — Assessment & Plan Note (Signed)
 Patient is seen by neurology, who manage this condition.  She is well controlled with current therapy.   Will defer to them for further changes to plan of care.

## 2024-10-25 ENCOUNTER — Ambulatory Visit
Admission: RE | Admit: 2024-10-25 | Discharge: 2024-10-25 | Disposition: A | Source: Ambulatory Visit | Attending: Orthopedic Surgery | Admitting: Orthopedic Surgery

## 2024-10-25 DIAGNOSIS — M542 Cervicalgia: Secondary | ICD-10-CM

## 2024-10-26 ENCOUNTER — Ambulatory Visit: Admitting: Podiatry

## 2024-10-26 VITALS — Ht 67.0 in | Wt 160.2 lb

## 2024-10-26 DIAGNOSIS — L84 Corns and callosities: Secondary | ICD-10-CM | POA: Diagnosis not present

## 2024-10-26 DIAGNOSIS — L600 Ingrowing nail: Secondary | ICD-10-CM

## 2024-10-26 NOTE — Progress Notes (Signed)
"  °  Subjective:  Patient ID: Diane Knapp, female    DOB: October 11, 1966,  MRN: [id number]  Chief Complaint  Patient presents with   Ingrown Toenail    Rm 6 Patient is here for callus trim and possible bilateral ingrown toenails of the halluces (lateral borders).    59 y.o. female presents with the above complaint. History confirmed with patient. She went for a pedicure and they tried to get the corners out but they were unsuccessful.  Her calluses have also returned to  Objective:  Physical Exam: warm, good capillary refill, no trophic changes or ulcerative lesions, normal DP and PT pulses, normal sensory exam, and multiple calluses bilateral first MTP joint, posterior lateral heel, incurvated hallux lateral border without paronychia  Assessment:   1. Ingrowing right great toenail   2. Ingrowing left great toenail   3. Callus of foot      Plan:  Patient was evaluated and treated and all questions answered.  We discussed treatment of ingrown toenails including permanent partial matricectomy.  She declined this option today and as a courtesy I debrided the nail in a slant back fashion to alleviate the offending border.  She will continue with regular pedicures for this and if this returns or worsens return for the procedure.   Regarding her multiple calluses we discussed her likely is not a good long-term permanent solution to eliminate or Retacrit these but regular care with using a pumice stone file and emollient lotion such as urea 40% cream and/or salicylic acid intermittently such as corn or callus remover pads may offer some relief.  I discussed with her that regular debridement of these in the office is not a covered service for her as she does not have any qualifying conditions for Medicare plan and Medicaid does not cover this at all.  She will follow-up with us  as needed.  Return if symptoms worsen or fail to improve.   "

## 2024-10-26 NOTE — Patient Instructions (Signed)
 Look for urea 40% cream or ointment and apply to the thickened dry skin / calluses. This can be bought over the counter, at a pharmacy or online such as [employer]. Salicylic acid corn or wart remover pads 40% may also be effective. I do not think you will be able to get rid of these entirely but will have to be controlled. Filing or usign a pumice stone can help prevent buildup. Trimming the calluses here is possible but would not be a covered service under your current insurance plan and would be an out of pocket expense

## 2024-11-05 ENCOUNTER — Ambulatory Visit
Admission: RE | Admit: 2024-11-05 | Discharge: 2024-11-05 | Disposition: A | Discharge status: Home / Self Care | Source: Ambulatory Visit | Attending: Family | Admitting: Family

## 2024-11-05 DIAGNOSIS — Z1231 Encounter for screening mammogram for malignant neoplasm of breast: Secondary | ICD-10-CM | POA: Diagnosis present

## 2024-11-17 ENCOUNTER — Ambulatory Visit: Admitting: Psychiatry

## 2024-12-07 ENCOUNTER — Ambulatory Visit: Admitting: Family
